# Patient Record
Sex: Male | Born: 1965
Health system: Southern US, Community
[De-identification: ages and names within clinical notes are randomized; demographics above are authoritative.]

## PROBLEM LIST (undated history)

## (undated) DIAGNOSIS — M069 Rheumatoid arthritis, unspecified: Secondary | ICD-10-CM

## (undated) DIAGNOSIS — I420 Dilated cardiomyopathy: Secondary | ICD-10-CM

## (undated) DIAGNOSIS — E039 Hypothyroidism, unspecified: Secondary | ICD-10-CM

## (undated) DIAGNOSIS — R002 Palpitations: Secondary | ICD-10-CM

## (undated) DIAGNOSIS — E785 Hyperlipidemia, unspecified: Secondary | ICD-10-CM

## (undated) HISTORY — DX: Rheumatoid arthritis, unspecified: M06.9

## (undated) HISTORY — PX: HAND SURGERY: SHX662

## (undated) HISTORY — PX: HAND TENDON SURGERY: SHX663

## (undated) HISTORY — DX: Dilated cardiomyopathy: I42.0

## (undated) HISTORY — DX: Palpitations: R00.2

## (undated) HISTORY — DX: Hypothyroidism, unspecified: E03.9

## (undated) HISTORY — DX: Hyperlipidemia, unspecified: E78.5

---

## 2015-09-13 DIAGNOSIS — M0579 Rheumatoid arthritis with rheumatoid factor of multiple sites without organ or systems involvement: Secondary | ICD-10-CM | POA: Insufficient documentation

## 2015-09-26 DIAGNOSIS — H04129 Dry eye syndrome of unspecified lacrimal gland: Secondary | ICD-10-CM | POA: Insufficient documentation

## 2015-09-26 DIAGNOSIS — E291 Testicular hypofunction: Secondary | ICD-10-CM | POA: Insufficient documentation

## 2015-09-26 DIAGNOSIS — R002 Palpitations: Secondary | ICD-10-CM | POA: Insufficient documentation

## 2015-09-26 DIAGNOSIS — H9190 Unspecified hearing loss, unspecified ear: Secondary | ICD-10-CM | POA: Insufficient documentation

## 2015-09-26 DIAGNOSIS — I10 Essential (primary) hypertension: Secondary | ICD-10-CM | POA: Insufficient documentation

## 2015-09-26 DIAGNOSIS — E039 Hypothyroidism, unspecified: Secondary | ICD-10-CM | POA: Insufficient documentation

## 2015-10-04 DIAGNOSIS — R7611 Nonspecific reaction to tuberculin skin test without active tuberculosis: Secondary | ICD-10-CM | POA: Insufficient documentation

## 2016-02-29 DIAGNOSIS — R21 Rash and other nonspecific skin eruption: Secondary | ICD-10-CM | POA: Diagnosis not present

## 2016-04-16 DIAGNOSIS — M659 Synovitis and tenosynovitis, unspecified: Secondary | ICD-10-CM | POA: Insufficient documentation

## 2016-04-16 DIAGNOSIS — M65939 Unspecified synovitis and tenosynovitis, unspecified forearm: Secondary | ICD-10-CM | POA: Insufficient documentation

## 2016-04-17 ENCOUNTER — Ambulatory Visit: Payer: BLUE CROSS/BLUE SHIELD | Admitting: Internal Medicine

## 2016-04-23 DIAGNOSIS — M25531 Pain in right wrist: Secondary | ICD-10-CM | POA: Insufficient documentation

## 2016-06-04 DIAGNOSIS — M25531 Pain in right wrist: Secondary | ICD-10-CM | POA: Diagnosis not present

## 2016-06-04 DIAGNOSIS — M654 Radial styloid tenosynovitis [de Quervain]: Secondary | ICD-10-CM | POA: Diagnosis not present

## 2016-07-09 DIAGNOSIS — Z79899 Other long term (current) drug therapy: Secondary | ICD-10-CM | POA: Diagnosis not present

## 2016-07-09 DIAGNOSIS — M0579 Rheumatoid arthritis with rheumatoid factor of multiple sites without organ or systems involvement: Secondary | ICD-10-CM | POA: Diagnosis not present

## 2016-08-13 DIAGNOSIS — E291 Testicular hypofunction: Secondary | ICD-10-CM | POA: Diagnosis not present

## 2016-08-31 DIAGNOSIS — L509 Urticaria, unspecified: Secondary | ICD-10-CM | POA: Diagnosis not present

## 2016-08-31 DIAGNOSIS — B360 Pityriasis versicolor: Secondary | ICD-10-CM | POA: Diagnosis not present

## 2016-09-19 DIAGNOSIS — I420 Dilated cardiomyopathy: Secondary | ICD-10-CM | POA: Insufficient documentation

## 2016-09-19 DIAGNOSIS — R002 Palpitations: Secondary | ICD-10-CM | POA: Diagnosis not present

## 2016-09-19 DIAGNOSIS — M0579 Rheumatoid arthritis with rheumatoid factor of multiple sites without organ or systems involvement: Secondary | ICD-10-CM | POA: Diagnosis not present

## 2016-09-28 DIAGNOSIS — M0579 Rheumatoid arthritis with rheumatoid factor of multiple sites without organ or systems involvement: Secondary | ICD-10-CM | POA: Diagnosis not present

## 2016-09-28 DIAGNOSIS — Z79899 Other long term (current) drug therapy: Secondary | ICD-10-CM | POA: Diagnosis not present

## 2016-10-17 DIAGNOSIS — Z79899 Other long term (current) drug therapy: Secondary | ICD-10-CM | POA: Diagnosis not present

## 2016-10-17 DIAGNOSIS — M0579 Rheumatoid arthritis with rheumatoid factor of multiple sites without organ or systems involvement: Secondary | ICD-10-CM | POA: Diagnosis not present

## 2016-10-17 DIAGNOSIS — M654 Radial styloid tenosynovitis [de Quervain]: Secondary | ICD-10-CM | POA: Diagnosis not present

## 2016-11-14 DIAGNOSIS — I420 Dilated cardiomyopathy: Secondary | ICD-10-CM | POA: Diagnosis not present

## 2016-11-14 DIAGNOSIS — M0579 Rheumatoid arthritis with rheumatoid factor of multiple sites without organ or systems involvement: Secondary | ICD-10-CM | POA: Diagnosis not present

## 2016-11-14 DIAGNOSIS — R002 Palpitations: Secondary | ICD-10-CM | POA: Diagnosis not present

## 2016-12-14 DIAGNOSIS — M069 Rheumatoid arthritis, unspecified: Secondary | ICD-10-CM | POA: Diagnosis not present

## 2016-12-14 DIAGNOSIS — R002 Palpitations: Secondary | ICD-10-CM | POA: Diagnosis not present

## 2017-01-02 DIAGNOSIS — M654 Radial styloid tenosynovitis [de Quervain]: Secondary | ICD-10-CM | POA: Diagnosis not present

## 2017-01-03 DIAGNOSIS — M654 Radial styloid tenosynovitis [de Quervain]: Secondary | ICD-10-CM | POA: Diagnosis not present

## 2017-01-03 DIAGNOSIS — Z79899 Other long term (current) drug therapy: Secondary | ICD-10-CM | POA: Diagnosis not present

## 2017-01-03 DIAGNOSIS — B372 Candidiasis of skin and nail: Secondary | ICD-10-CM | POA: Diagnosis not present

## 2017-01-03 DIAGNOSIS — M069 Rheumatoid arthritis, unspecified: Secondary | ICD-10-CM | POA: Diagnosis not present

## 2017-01-13 DIAGNOSIS — Z79899 Other long term (current) drug therapy: Secondary | ICD-10-CM | POA: Diagnosis not present

## 2017-01-21 DIAGNOSIS — M654 Radial styloid tenosynovitis [de Quervain]: Secondary | ICD-10-CM | POA: Diagnosis not present

## 2017-01-21 DIAGNOSIS — M65841 Other synovitis and tenosynovitis, right hand: Secondary | ICD-10-CM | POA: Diagnosis not present

## 2017-01-28 DIAGNOSIS — M654 Radial styloid tenosynovitis [de Quervain]: Secondary | ICD-10-CM | POA: Diagnosis not present

## 2017-01-30 DIAGNOSIS — E291 Testicular hypofunction: Secondary | ICD-10-CM | POA: Diagnosis not present

## 2017-02-25 DIAGNOSIS — Z79899 Other long term (current) drug therapy: Secondary | ICD-10-CM | POA: Diagnosis not present

## 2017-02-25 DIAGNOSIS — M79644 Pain in right finger(s): Secondary | ICD-10-CM | POA: Diagnosis not present

## 2017-02-25 DIAGNOSIS — M069 Rheumatoid arthritis, unspecified: Secondary | ICD-10-CM | POA: Diagnosis not present

## 2017-02-27 DIAGNOSIS — E291 Testicular hypofunction: Secondary | ICD-10-CM | POA: Diagnosis not present

## 2017-03-02 DIAGNOSIS — Z79899 Other long term (current) drug therapy: Secondary | ICD-10-CM | POA: Diagnosis not present

## 2017-03-02 DIAGNOSIS — M069 Rheumatoid arthritis, unspecified: Secondary | ICD-10-CM | POA: Diagnosis not present

## 2017-04-01 DIAGNOSIS — M65841 Other synovitis and tenosynovitis, right hand: Secondary | ICD-10-CM | POA: Diagnosis not present

## 2017-04-28 DIAGNOSIS — Z79899 Other long term (current) drug therapy: Secondary | ICD-10-CM | POA: Diagnosis not present

## 2017-04-29 DIAGNOSIS — M654 Radial styloid tenosynovitis [de Quervain]: Secondary | ICD-10-CM | POA: Diagnosis not present

## 2017-04-29 DIAGNOSIS — M79641 Pain in right hand: Secondary | ICD-10-CM | POA: Diagnosis not present

## 2017-04-29 DIAGNOSIS — M659 Synovitis and tenosynovitis, unspecified: Secondary | ICD-10-CM | POA: Diagnosis not present

## 2017-06-24 DIAGNOSIS — M659 Synovitis and tenosynovitis, unspecified: Secondary | ICD-10-CM | POA: Diagnosis not present

## 2017-06-24 DIAGNOSIS — Z23 Encounter for immunization: Secondary | ICD-10-CM | POA: Diagnosis not present

## 2017-06-24 DIAGNOSIS — Z79899 Other long term (current) drug therapy: Secondary | ICD-10-CM | POA: Diagnosis not present

## 2017-06-24 DIAGNOSIS — M79641 Pain in right hand: Secondary | ICD-10-CM | POA: Diagnosis not present

## 2017-06-24 DIAGNOSIS — M069 Rheumatoid arthritis, unspecified: Secondary | ICD-10-CM | POA: Diagnosis not present

## 2017-08-08 DIAGNOSIS — M79644 Pain in right finger(s): Secondary | ICD-10-CM | POA: Diagnosis not present

## 2017-08-08 DIAGNOSIS — E291 Testicular hypofunction: Secondary | ICD-10-CM | POA: Diagnosis not present

## 2017-08-08 DIAGNOSIS — M069 Rheumatoid arthritis, unspecified: Secondary | ICD-10-CM | POA: Diagnosis not present

## 2017-08-09 DIAGNOSIS — Z79899 Other long term (current) drug therapy: Secondary | ICD-10-CM | POA: Diagnosis not present

## 2017-08-09 DIAGNOSIS — M12842 Other specific arthropathies, not elsewhere classified, left hand: Secondary | ICD-10-CM | POA: Diagnosis not present

## 2017-08-09 DIAGNOSIS — M12841 Other specific arthropathies, not elsewhere classified, right hand: Secondary | ICD-10-CM | POA: Diagnosis not present

## 2017-08-09 DIAGNOSIS — M0579 Rheumatoid arthritis with rheumatoid factor of multiple sites without organ or systems involvement: Secondary | ICD-10-CM | POA: Diagnosis not present

## 2017-08-30 DIAGNOSIS — M069 Rheumatoid arthritis, unspecified: Secondary | ICD-10-CM | POA: Diagnosis not present

## 2017-08-30 DIAGNOSIS — Z79899 Other long term (current) drug therapy: Secondary | ICD-10-CM | POA: Diagnosis not present

## 2017-08-30 DIAGNOSIS — M25541 Pain in joints of right hand: Secondary | ICD-10-CM | POA: Diagnosis not present

## 2017-08-30 DIAGNOSIS — M0579 Rheumatoid arthritis with rheumatoid factor of multiple sites without organ or systems involvement: Secondary | ICD-10-CM | POA: Diagnosis not present

## 2017-08-30 DIAGNOSIS — M25542 Pain in joints of left hand: Secondary | ICD-10-CM | POA: Diagnosis not present

## 2017-10-02 DIAGNOSIS — M25542 Pain in joints of left hand: Secondary | ICD-10-CM | POA: Diagnosis not present

## 2017-10-02 DIAGNOSIS — M65839 Other synovitis and tenosynovitis, unspecified forearm: Secondary | ICD-10-CM | POA: Diagnosis not present

## 2017-10-02 DIAGNOSIS — M069 Rheumatoid arthritis, unspecified: Secondary | ICD-10-CM | POA: Diagnosis not present

## 2017-10-27 DIAGNOSIS — M0579 Rheumatoid arthritis with rheumatoid factor of multiple sites without organ or systems involvement: Secondary | ICD-10-CM | POA: Diagnosis not present

## 2017-10-27 DIAGNOSIS — E039 Hypothyroidism, unspecified: Secondary | ICD-10-CM | POA: Diagnosis not present

## 2017-10-28 DIAGNOSIS — M25542 Pain in joints of left hand: Secondary | ICD-10-CM | POA: Diagnosis not present

## 2017-10-28 DIAGNOSIS — M65839 Other synovitis and tenosynovitis, unspecified forearm: Secondary | ICD-10-CM | POA: Diagnosis not present

## 2017-10-28 DIAGNOSIS — M069 Rheumatoid arthritis, unspecified: Secondary | ICD-10-CM | POA: Diagnosis not present

## 2018-01-06 DIAGNOSIS — M25542 Pain in joints of left hand: Secondary | ICD-10-CM | POA: Diagnosis not present

## 2018-01-06 DIAGNOSIS — E291 Testicular hypofunction: Secondary | ICD-10-CM | POA: Diagnosis not present

## 2018-01-06 DIAGNOSIS — M069 Rheumatoid arthritis, unspecified: Secondary | ICD-10-CM | POA: Diagnosis not present

## 2018-01-06 DIAGNOSIS — M65832 Other synovitis and tenosynovitis, left forearm: Secondary | ICD-10-CM | POA: Diagnosis not present

## 2018-01-06 DIAGNOSIS — Z125 Encounter for screening for malignant neoplasm of prostate: Secondary | ICD-10-CM | POA: Diagnosis not present

## 2018-01-09 ENCOUNTER — Encounter: Payer: Self-pay | Admitting: Cardiology

## 2018-01-09 ENCOUNTER — Ambulatory Visit: Payer: BLUE CROSS/BLUE SHIELD | Admitting: Cardiology

## 2018-01-09 VITALS — BP 92/60 | HR 56 | Ht 73.5 in | Wt 185.8 lb

## 2018-01-09 DIAGNOSIS — I420 Dilated cardiomyopathy: Secondary | ICD-10-CM

## 2018-01-09 DIAGNOSIS — R002 Palpitations: Secondary | ICD-10-CM

## 2018-01-09 NOTE — Patient Instructions (Signed)
Medication Instructions:  Your physician recommends that you continue on your current medications as directed. Please refer to the Current Medication list given to you today.   Labwork: None  Testing/Procedures: You had an EKG today.   Your physician has requested that you have an echocardiogram. Echocardiography is a painless test that uses sound waves to create images of your heart. It provides your doctor with information about the size and shape of your heart and how well your heart's chambers and valves are working. This procedure takes approximately one hour. There are no restrictions for this procedure.  Follow-Up: Your physician recommends that you schedule a follow-up appointment after getting the results from your echocardiogram.   If you need a refill on your cardiac medications before your next appointment, please call your pharmacy.   Thank you for choosing [facility]! Mady Gemmaatherine Jatniel Verastegui, RN [phone]

## 2018-01-09 NOTE — Progress Notes (Signed)
Cardiology Office Note:    Date:  01/09/2018   ID:  Cameron Olson, DOB 04/29/1966, MRN [id number]  PCP:  Francee GentileZiolkowska, Aldona, MD  Cardiologist:  Gypsy Balsamobert Krasowski, MD    Referring MD: No ref. provider found   Chief Complaint  Patient presents with  . Follow-up  I had episodes of dizziness  History of Present Illness:    Cameron Damaicolae Bolt is a 52 y.o. male with remote history of cardiomyopathy however normalization of ejection fraction occurred since that time he has been doing well.  Also got episodes of palpitations form of premature ventricular beats managed successfully with beta-blocker.  Couple weeks ago he was exercising at the gym he pushed himself heart on elliptical he started getting somewhat dizzy and then he ended up going to the weight machine and started lifting some weights became dizzy got up went outside went to his apartment barely made it hard to lay down in the bed for about 10 minutes until he was fine.  All the day he felt weak and tired.  He drank plenty of fluids when he was exercising but again he pushed unusually hard himself during his exercise session.  That time he did not exercise. He denies having a chest pain tightness squeezing pressure burning chest  No past medical history on file.    Current Medications: Current Meds  Medication Sig  . Adalimumab (HUMIRA PEN) 40 MG/0.4ML PNKT Inject 40 mg into the muscle every 14 (fourteen) days.  . carvedilol (COREG) 6.25 MG tablet Take 1 tablet by mouth 3 (three) times daily.   . Cholecalciferol (VITAMIN D-1000 MAX ST) 1000 units tablet Take 1 tablet by mouth daily.  . DOCOSAHEXAENOIC ACID PO Take 1 g by mouth daily.  . folic acid (FOLVITE) 1 MG tablet Take 1 tablet by mouth daily.  . hydroxychloroquine (PLAQUENIL) 200 MG tablet Take 1 tablet by mouth 2 (two) times daily.   Marland Kitchen. levothyroxine (SYNTHROID) 75 MCG tablet Take 1 tablet by mouth daily.  . vitamin B-12 (CYANOCOBALAMIN) 1000 MCG tablet Take 1 tablet by mouth  daily.     Allergies:   Patient has no known allergies.   Social History   Socioeconomic History  . Marital status: Unknown    Spouse name: Not on file  . Number of children: Not on file  . Years of education: Not on file  . Highest education level: Not on file  Occupational History  . Not on file  Social Needs  . Financial resource strain: Not on file  . Food insecurity:    Worry: Not on file    Inability: Not on file  . Transportation needs:    Medical: Not on file    Non-medical: Not on file  Tobacco Use  . Smoking status: Never Smoker  . Smokeless tobacco: Never Used  Substance and Sexual Activity  . Alcohol use: Never    Frequency: Never  . Drug use: Never  . Sexual activity: Not on file  Lifestyle  . Physical activity:    Days per week: Not on file    Minutes per session: Not on file  . Stress: Not on file  Relationships  . Social connections:    Talks on phone: Not on file    Gets together: Not on file    Attends religious service: Not on file    Active member of club or organization: Not on file    Attends meetings of clubs or organizations: Not on file    Relationship  status: Not on file  Other Topics Concern  . Not on file  Social History Narrative  . Not on file     Family History: The patient's family history includes Diabetes in his mother; Heart disease in his mother; Hypertension in his father. ROS:   Please see the history of present illness.    All 14 point review of systems negative except as described per history of present illness  EKGs/Labs/Other Studies Reviewed:      Recent Labs: No results found for requested labs within last 8760 hours.  Recent Lipid Panel No results found for: CHOL, TRIG, HDL, CHOLHDL, VLDL, LDLCALC, LDLDIRECT  Physical Exam:    VS:  BP 92/60   Pulse (!) 56   Ht 6' 1.5" (1.867 m)   Wt 185 lb 12.8 oz (84.3 kg)   SpO2 98%   BMI 24.18 kg/m     Wt Readings from Last 3 Encounters:  01/09/18 185 lb 12.8  oz (84.3 kg)     GEN:  Well nourished, well developed in no acute distress HEENT: Normal NECK: No JVD; No carotid bruits LYMPHATICS: No lymphadenopathy CARDIAC: RRR, no murmurs, no rubs, no gallops RESPIRATORY:  Clear to auscultation without rales, wheezing or rhonchi  ABDOMEN: Soft, non-tender, non-distended MUSCULOSKELETAL:  No edema; No deformity  SKIN: Warm and dry LOWER EXTREMITIES: no swelling NEUROLOGIC:  Alert and oriented x 3 PSYCHIATRIC:  Normal affect   ASSESSMENT:    1. Dilated cardiomyopathy (HCC)   2. Palpitations    PLAN:    In order of problems listed above:  1. Episodes of dizziness I suspect he simply push himself too hard while exercising.  I told him that he need to drink plenty of fluids when he exercise also he need to drink and eat before he goes and exercise.  He only had some almond milk before he went to exercise. 2. Since he does have remote history of cardia myopathy I will ask him to have echocardiogram to assess his left ventricular ejection fraction. 3. I see him back in the office within next few weeks to months.  He lives in [place] right now he will be in touch with me over the email. 4. I told him not to exercise until we get his echocardiogram.    Medication Adjustments/Labs and Tests Ordered: Current medicines are reviewed at length with the patient today.  Concerns regarding medicines are outlined above.  No orders of the defined types were placed in this encounter.  Medication changes: No orders of the defined types were placed in this encounter.   Signed, Georgeanna Lea, MD, [facility] 01/09/2018 4:44 PM    [facility]

## 2018-01-11 DIAGNOSIS — Z79899 Other long term (current) drug therapy: Secondary | ICD-10-CM | POA: Diagnosis not present

## 2018-01-11 DIAGNOSIS — M0579 Rheumatoid arthritis with rheumatoid factor of multiple sites without organ or systems involvement: Secondary | ICD-10-CM | POA: Diagnosis not present

## 2018-01-13 ENCOUNTER — Ambulatory Visit (HOSPITAL_COMMUNITY): Payer: BLUE CROSS/BLUE SHIELD | Attending: Cardiovascular Disease

## 2018-01-13 ENCOUNTER — Other Ambulatory Visit: Payer: Self-pay

## 2018-01-13 DIAGNOSIS — I420 Dilated cardiomyopathy: Secondary | ICD-10-CM | POA: Diagnosis not present

## 2018-01-13 DIAGNOSIS — E291 Testicular hypofunction: Secondary | ICD-10-CM | POA: Diagnosis not present

## 2018-01-13 DIAGNOSIS — R002 Palpitations: Secondary | ICD-10-CM

## 2018-01-13 DIAGNOSIS — N5201 Erectile dysfunction due to arterial insufficiency: Secondary | ICD-10-CM | POA: Diagnosis not present

## 2018-01-13 DIAGNOSIS — I10 Essential (primary) hypertension: Secondary | ICD-10-CM | POA: Diagnosis not present

## 2018-01-14 ENCOUNTER — Other Ambulatory Visit: Payer: Self-pay

## 2018-01-14 ENCOUNTER — Telehealth: Payer: Self-pay

## 2018-01-14 MED ORDER — CARVEDILOL 25 MG PO TABS: 25.0000 mg | ORAL_TABLET | Freq: Two times a day (BID) | ORAL | 3 refills | Status: DC

## 2018-01-14 MED ORDER — CARVEDILOL 6.25 MG PO TABS: 3.1250 mg | ORAL_TABLET | Freq: Every day | ORAL | 5 refills | Status: DC

## 2018-01-14 NOTE — Telephone Encounter (Signed)
Left message to return call to confirm dosage of carvedilol prior to sending in refill for 90 day supply to [facility] per message from Dr. Bing MatterKrasowski.

## 2018-01-14 NOTE — Telephone Encounter (Signed)
Per Dr. Bing MatterKrasowski patient may take 25 mg of carvedilol twice daily and additional 3.125 mg carvedilol in the evening. If needed the 3.125 mg carvedilol maybe increased to 6.25 mg every evening. Patient was agreeable.

## 2018-01-31 ENCOUNTER — Encounter: Payer: Self-pay | Admitting: Internal Medicine

## 2018-02-11 ENCOUNTER — Institutional Professional Consult (permissible substitution): Payer: BLUE CROSS/BLUE SHIELD | Admitting: Internal Medicine

## 2018-02-11 ENCOUNTER — Encounter: Payer: Self-pay | Admitting: Cardiology

## 2018-02-11 ENCOUNTER — Ambulatory Visit (INDEPENDENT_AMBULATORY_CARE_PROVIDER_SITE_OTHER): Payer: BLUE CROSS/BLUE SHIELD | Admitting: Cardiology

## 2018-02-11 VITALS — BP 98/72 | HR 51 | Ht 73.5 in | Wt 197.0 lb

## 2018-02-11 DIAGNOSIS — I420 Dilated cardiomyopathy: Secondary | ICD-10-CM

## 2018-02-11 DIAGNOSIS — I10 Essential (primary) hypertension: Secondary | ICD-10-CM

## 2018-02-11 DIAGNOSIS — M0579 Rheumatoid arthritis with rheumatoid factor of multiple sites without organ or systems involvement: Secondary | ICD-10-CM | POA: Diagnosis not present

## 2018-02-11 DIAGNOSIS — Z79899 Other long term (current) drug therapy: Secondary | ICD-10-CM | POA: Diagnosis not present

## 2018-02-11 DIAGNOSIS — R002 Palpitations: Secondary | ICD-10-CM

## 2018-02-11 DIAGNOSIS — B029 Zoster without complications: Secondary | ICD-10-CM | POA: Diagnosis not present

## 2018-02-11 NOTE — Patient Instructions (Signed)
Medication Instructions:  Your physician recommends that you continue on your current medications as directed. Please refer to the Current Medication list given to you today.  Labwork: None  Testing/Procedures: None  Follow-Up: Please call for a follow up appointment  Any Other Special Instructions Will Be Listed Below (If Applicable).     If you need a refill on your cardiac medications before your next appointment, please call your pharmacy.   [facility]  Garey HamAshley A, RN, BSN

## 2018-02-11 NOTE — Progress Notes (Signed)
Cardiology Office Note:    Date:  02/11/2018   ID:  Cameron Olson, DOB 12/04/1965, MRN [id number]  PCP:  Francee GentileZiolkowska, Aldona, MD  Cardiologist:  Gypsy Balsamobert Simren Popson, MD    Referring MD: Francee GentileZiolkowska, Aldona, MD   No chief complaint on file. Doing better  History of Present Illness:    Cameron Damaicolae Dumais is a 52 y.o. male with palpitations.  Also remote history of cardiomyopathy but with normalization.  He is being in touch with me over email.  He is sending multiple EKGs so far all EKG showed normal sinus rhythm only one rhythm strip showed some multifocal atrial tachycardia.  Recently he was diagnosed with shingles he is been treated aggressively with antibiotic and doing better still complain of having some pain in the left armpit where he gets his shingles.  Also does not exercise on the regular basis but overall considering his feeling better.  Past Medical History:  Diagnosis Date  . Dilated cardiomyopathy (HCC)   . Palpitations     Past Surgical History:  Procedure Laterality Date  . HAND SURGERY      Current Medications: Current Meds  Medication Sig  . Adalimumab (HUMIRA PEN) 40 MG/0.4ML PNKT Inject 40 mg into the muscle every 14 (fourteen) days.  . carvedilol (COREG) 25 MG tablet Take 1 tablet (25 mg total) by mouth 2 (two) times daily with a meal.  . carvedilol (COREG) 6.25 MG tablet Take 0.5 tablets (3.125 mg total) by mouth daily. May increase to 6.25 mg daily if needed  . Cholecalciferol (VITAMIN D-1000 MAX ST) 1000 units tablet Take 1 tablet by mouth daily.  . folic acid (FOLVITE) 1 MG tablet Take 1 tablet by mouth daily.  . hydroxychloroquine (PLAQUENIL) 200 MG tablet Take 1 tablet by mouth 2 (two) times daily.   Marland Kitchen. levothyroxine (SYNTHROID) 75 MCG tablet Take 1 tablet by mouth daily.  . vitamin B-12 (CYANOCOBALAMIN) 1000 MCG tablet Take 1 tablet by mouth daily.     Allergies:   Patient has no known allergies.   Social History   Socioeconomic History  . Marital status:  Unknown    Spouse name: Not on file  . Number of children: Not on file  . Years of education: Not on file  . Highest education level: Not on file  Occupational History  . Not on file  Social Needs  . Financial resource strain: Not on file  . Food insecurity:    Worry: Not on file    Inability: Not on file  . Transportation needs:    Medical: Not on file    Non-medical: Not on file  Tobacco Use  . Smoking status: Never Smoker  . Smokeless tobacco: Never Used  Substance and Sexual Activity  . Alcohol use: Never    Frequency: Never  . Drug use: Never  . Sexual activity: Not on file  Lifestyle  . Physical activity:    Days per week: Not on file    Minutes per session: Not on file  . Stress: Not on file  Relationships  . Social connections:    Talks on phone: Not on file    Gets together: Not on file    Attends religious service: Not on file    Active member of club or organization: Not on file    Attends meetings of clubs or organizations: Not on file    Relationship status: Not on file  Other Topics Concern  . Not on file  Social History Narrative  . Not  on file     Family History: The patient's family history includes Diabetes in his mother; Heart disease in his mother; Hypertension in his father. ROS:   Please see the history of present illness.    All 14 point review of systems negative except as described per history of present illness  EKGs/Labs/Other Studies Reviewed:      Recent Labs: No results found for requested labs within last 8760 hours.  Recent Lipid Panel No results found for: CHOL, TRIG, HDL, CHOLHDL, VLDL, LDLCALC, LDLDIRECT  Physical Exam:    VS:  BP 98/72 (BP Location: Right Arm, Patient Position: Sitting, Cuff Size: Normal)   Pulse (!) 51   Ht 6' 1.5" (1.867 m)   Wt 197 lb (89.4 kg)   SpO2 99%   BMI 25.64 kg/m     Wt Readings from Last 3 Encounters:  02/11/18 197 lb (89.4 kg)  01/09/18 185 lb 12.8 oz (84.3 kg)     GEN:  Well  nourished, well developed in no acute distress HEENT: Normal NECK: No JVD; No carotid bruits LYMPHATICS: No lymphadenopathy CARDIAC: RRR, no murmurs, no rubs, no gallops RESPIRATORY:  Clear to auscultation without rales, wheezing or rhonchi  ABDOMEN: Soft, non-tender, non-distended MUSCULOSKELETAL:  No edema; No deformity  SKIN: Warm and dry LOWER EXTREMITIES: no swelling NEUROLOGIC:  Alert and oriented x 3 PSYCHIATRIC:  Normal affect   ASSESSMENT:    1. Benign essential hypertension   2. Palpitations   3. Rheumatoid arthritis involving multiple sites with positive rheumatoid factor (HCC)   4. Dilated cardiomyopathy (HCC)    PLAN:    In order of problems listed above:  1. Benign essential hypertension blood pressure well controlled continue present medications. 2. Palpitations with possibly multifocal atrial tachycardia.  He is doing better from that point review we started making some arrangements to see EP specialist however eventually ended up canceling this.  He will be back and states in October and at that time I will make arrangements for him to see EP office everything the past how he will feel. 3. Rheumatoid arthritis.  Followed by rheumatologist he is getting ready to see a rheumatologist today. 4. Remote history of dilated cardiomyopathy normalization of ejection fraction.  She brought some records from [place] that I have a chance to review.  Echocardiogram showed preserved left ventricular ejection fraction.  She is scheduled to have a Holter monitor done in [place] who will send results to me.   Medication Adjustments/Labs and Tests Ordered: Current medicines are reviewed at length with the patient today.  Concerns regarding medicines are outlined above.  No orders of the defined types were placed in this encounter.  Medication changes: No orders of the defined types were placed in this encounter.   Signed, Georgeanna Lea, MD, [facility] 02/11/2018 2:25 PM    [facility]

## 2018-02-12 DIAGNOSIS — M25542 Pain in joints of left hand: Secondary | ICD-10-CM | POA: Diagnosis not present

## 2018-05-27 DIAGNOSIS — M0579 Rheumatoid arthritis with rheumatoid factor of multiple sites without organ or systems involvement: Secondary | ICD-10-CM | POA: Diagnosis not present

## 2018-05-27 DIAGNOSIS — Z79899 Other long term (current) drug therapy: Secondary | ICD-10-CM | POA: Diagnosis not present

## 2018-05-28 DIAGNOSIS — M069 Rheumatoid arthritis, unspecified: Secondary | ICD-10-CM | POA: Diagnosis not present

## 2018-05-28 DIAGNOSIS — M25511 Pain in right shoulder: Secondary | ICD-10-CM | POA: Insufficient documentation

## 2018-05-28 DIAGNOSIS — Z79899 Other long term (current) drug therapy: Secondary | ICD-10-CM | POA: Diagnosis not present

## 2018-05-28 DIAGNOSIS — M25542 Pain in joints of left hand: Secondary | ICD-10-CM | POA: Diagnosis not present

## 2018-05-28 DIAGNOSIS — D72810 Lymphocytopenia: Secondary | ICD-10-CM | POA: Diagnosis not present

## 2018-05-28 DIAGNOSIS — M0579 Rheumatoid arthritis with rheumatoid factor of multiple sites without organ or systems involvement: Secondary | ICD-10-CM | POA: Diagnosis not present

## 2018-05-29 DIAGNOSIS — D72810 Lymphocytopenia: Secondary | ICD-10-CM | POA: Diagnosis not present

## 2018-08-20 DIAGNOSIS — M0579 Rheumatoid arthritis with rheumatoid factor of multiple sites without organ or systems involvement: Secondary | ICD-10-CM | POA: Diagnosis not present

## 2018-08-20 DIAGNOSIS — M069 Rheumatoid arthritis, unspecified: Secondary | ICD-10-CM | POA: Diagnosis not present

## 2018-08-20 DIAGNOSIS — M7541 Impingement syndrome of right shoulder: Secondary | ICD-10-CM | POA: Diagnosis not present

## 2018-08-20 DIAGNOSIS — E291 Testicular hypofunction: Secondary | ICD-10-CM | POA: Diagnosis not present

## 2018-08-20 DIAGNOSIS — Z79899 Other long term (current) drug therapy: Secondary | ICD-10-CM | POA: Diagnosis not present

## 2018-08-20 DIAGNOSIS — M25511 Pain in right shoulder: Secondary | ICD-10-CM | POA: Diagnosis not present

## 2018-11-28 DIAGNOSIS — M2550 Pain in unspecified joint: Secondary | ICD-10-CM | POA: Diagnosis not present

## 2018-12-08 DIAGNOSIS — M7541 Impingement syndrome of right shoulder: Secondary | ICD-10-CM | POA: Diagnosis not present

## 2018-12-08 DIAGNOSIS — M25542 Pain in joints of left hand: Secondary | ICD-10-CM | POA: Diagnosis not present

## 2018-12-08 DIAGNOSIS — M069 Rheumatoid arthritis, unspecified: Secondary | ICD-10-CM | POA: Diagnosis not present

## 2018-12-08 DIAGNOSIS — M25511 Pain in right shoulder: Secondary | ICD-10-CM | POA: Diagnosis not present

## 2018-12-20 DIAGNOSIS — R200 Anesthesia of skin: Secondary | ICD-10-CM | POA: Diagnosis not present

## 2019-01-23 DIAGNOSIS — R21 Rash and other nonspecific skin eruption: Secondary | ICD-10-CM | POA: Diagnosis not present

## 2019-01-23 DIAGNOSIS — B360 Pityriasis versicolor: Secondary | ICD-10-CM | POA: Diagnosis not present

## 2019-01-23 DIAGNOSIS — L2089 Other atopic dermatitis: Secondary | ICD-10-CM | POA: Diagnosis not present

## 2019-01-26 DIAGNOSIS — M7541 Impingement syndrome of right shoulder: Secondary | ICD-10-CM | POA: Diagnosis not present

## 2019-01-26 DIAGNOSIS — M069 Rheumatoid arthritis, unspecified: Secondary | ICD-10-CM | POA: Diagnosis not present

## 2019-01-26 DIAGNOSIS — M25511 Pain in right shoulder: Secondary | ICD-10-CM | POA: Diagnosis not present

## 2019-01-26 DIAGNOSIS — M25542 Pain in joints of left hand: Secondary | ICD-10-CM | POA: Diagnosis not present

## 2019-01-29 ENCOUNTER — Telehealth: Payer: BC Managed Care – PPO | Admitting: Cardiology

## 2019-01-29 ENCOUNTER — Other Ambulatory Visit: Payer: Self-pay

## 2019-01-30 ENCOUNTER — Other Ambulatory Visit: Payer: Self-pay | Admitting: Cardiology

## 2019-01-30 NOTE — Telephone Encounter (Signed)
Phoned patient, informed we have a refill request for carvedilol and need to clarify how he's taking medication. States he's in a conference and will call us back with that information. States he doesn't need the refill right now.

## 2019-02-11 DIAGNOSIS — M2550 Pain in unspecified joint: Secondary | ICD-10-CM | POA: Diagnosis not present

## 2019-02-11 DIAGNOSIS — Z79899 Other long term (current) drug therapy: Secondary | ICD-10-CM | POA: Diagnosis not present

## 2019-02-11 DIAGNOSIS — M0579 Rheumatoid arthritis with rheumatoid factor of multiple sites without organ or systems involvement: Secondary | ICD-10-CM | POA: Diagnosis not present

## 2019-02-11 DIAGNOSIS — R200 Anesthesia of skin: Secondary | ICD-10-CM | POA: Diagnosis not present

## 2019-02-12 ENCOUNTER — Telehealth (INDEPENDENT_AMBULATORY_CARE_PROVIDER_SITE_OTHER): Payer: BC Managed Care – PPO | Admitting: Cardiology

## 2019-02-12 ENCOUNTER — Encounter: Payer: Self-pay | Admitting: Cardiology

## 2019-02-12 ENCOUNTER — Other Ambulatory Visit: Payer: Self-pay

## 2019-02-12 VITALS — Wt 185.0 lb

## 2019-02-12 DIAGNOSIS — I10 Essential (primary) hypertension: Secondary | ICD-10-CM

## 2019-02-12 DIAGNOSIS — M0579 Rheumatoid arthritis with rheumatoid factor of multiple sites without organ or systems involvement: Secondary | ICD-10-CM

## 2019-02-12 DIAGNOSIS — I420 Dilated cardiomyopathy: Secondary | ICD-10-CM

## 2019-02-12 MED ORDER — CARVEDILOL 6.25 MG PO TABS: 6.2500 mg | ORAL_TABLET | Freq: Every day | ORAL | 1 refills | Status: DC

## 2019-02-12 MED ORDER — CARVEDILOL 25 MG PO TABS: 25.0000 mg | ORAL_TABLET | Freq: Every day | ORAL | 1 refills | Status: DC

## 2019-02-12 MED ORDER — CARVEDILOL 6.25 MG PO TABS: 6.2500 mg | ORAL_TABLET | Freq: Two times a day (BID) | ORAL | 1 refills | Status: DC

## 2019-02-12 NOTE — Addendum Note (Signed)
Addended by: Particia Nearing B on: 02/12/2019 05:01 PM   Modules accepted: Orders

## 2019-02-12 NOTE — Patient Instructions (Addendum)
Medication Instructions:  Your physician has recommended you make the following change in your medication:   COREG 6.25 mg: 1 tab twice daily And Coreg 25 mg : 1 tab daily   If you need a refill on your cardiac medications before your next appointment, please call your pharmacy.   Lab work: None If you have labs (blood work) drawn today and your tests are completely normal, you will receive your results only by: Marland Kitchen MyChart Message (if you have MyChart) OR . A paper copy in the mail If you have any lab test that is abnormal or we need to change your treatment, we will call you to review the results.  Testing/Procedures: Your physician has requested that you have an echocardiogram. Echocardiography is a painless test that uses sound waves to create images of your heart. It provides your doctor with information about the size and shape of your heart and how well your heart's chambers and valves are working. This procedure takes approximately one hour. There are no restrictions for this procedure.  YOUR APPOINTMENT FOR YOUR ECHO IS July 28,2020 at 8:15 PM  Follow-Up: At [facility], you and your health needs are our priority.  As part of our continuing mission to provide you with exceptional heart care, we have created designated Provider Care Teams.  These Care Teams include your primary Cardiologist (physician) and Advanced Practice Providers (APPs -  Physician Assistants and Nurse Practitioners) who all work together to provide you with the care you need, when you need it. You will need a follow up appointment in 3 months.  Any Other Special Instructions Will Be Listed Below (If Applicable).

## 2019-02-12 NOTE — Progress Notes (Signed)
*    Virtual Visit via Video Note   This visit type was conducted due to national recommendations for restrictions regarding the COVID-19 Pandemic (e.g. social distancing) in an effort to limit this patient's exposure and mitigate transmission in our community.  Due to his co-morbid illnesses, this patient is at least at moderate risk for complications without adequate follow up.  This format is felt to be most appropriate for this patient at this time.  All issues noted in this document were discussed and addressed.  A limited physical exam was performed with this format.  Please refer to the patient's chart for his consent to telehealth for [facility].  Evaluation Performed:  Follow-up visit  This visit type was conducted due to national recommendations for restrictions regarding the COVID-19 Pandemic (e.g. social distancing).  This format is felt to be most appropriate for this patient at this time.  All issues noted in this document were discussed and addressed.  No physical exam was performed (except for noted visual exam findings with Video Visits).  Please refer to the patient's chart (MyChart message for video visits and phone note for telephone visits) for the patient's consent to telehealth [facility].  Date:  02/12/2019  ID: Cameron Olson, DOB 03/23/1966, MRN [id number]   Patient [place] [address] [place] [id number]   Provider [facility]   [facility] [place] [facility]  PCP:  Francee GentileZiolkowska, Aldona, MD  Cardiologist:  Gypsy Balsamobert Krasowski, MD     Chief Complaint: I am not doing well  History of Present Illness:    Cameron Damaicolae Erno is a 53 y.o. male  who presents via audio/video conferencing for a telehealth visit today.  With history of cardiomyopathy discovered many years ago however normalization after that and all echocardiogram showed normal left ventricular ejection fraction.  Also palpitations some form of PVCs successfully suppressed with beta-blocker.  He  also does have rheumatoid arthritis which appears to be difficult to control.  He goes through very difficult time right now.  Because of coronavirus situation he lives in the hotel.  He lives for last 4 months in one room.  Not clearly affecting him psychologically.  The reason why he wanted to talk to me is the fact that he does have some redness in his legs.  Thinking is that this is may be neuropathy related to some of rheumatoid medication he has been using.  The low his legs are not hurting he noticed redness of his legs when he tried to run up and down stairs.  Denies having any issue with his heart there is no chest pain tightness squeezing pressure burning chest.  Palpitations seems to be under control.   The patient does not have symptoms concerning for COVID-19 infection (fever, chills, cough, or new SHORTNESS OF BREATH).    Prior CV studies:   The following studies were reviewed today:       Past Medical History:  Diagnosis Date  . Dilated cardiomyopathy (HCC)   . Palpitations     Past Surgical History:  Procedure Laterality Date  . HAND SURGERY       Current Meds  Medication Sig  . Adalimumab (HUMIRA PEN) 40 MG/0.4ML PNKT Inject 40 mg into the muscle every 14 (fourteen) days.  . carvedilol (COREG) 25 MG tablet Take 1 tablet (25 mg total) by mouth 2 (two) times daily with a meal.  . carvedilol (COREG) 6.25 MG tablet Take 0.5 tablets (3.125 mg total) by mouth daily. May increase  to 6.25 mg daily if needed  . Cholecalciferol (VITAMIN D-1000 MAX ST) 1000 units tablet Take 1 tablet by mouth daily.  . folic acid (FOLVITE) 1 MG tablet Take 1 tablet by mouth daily.  . hydroxychloroquine (PLAQUENIL) 200 MG tablet Take 1 tablet by mouth 2 (two) times daily.   Marland Kitchen levothyroxine (SYNTHROID) 75 MCG tablet Take 1 tablet by mouth daily.  . vitamin B-12 (CYANOCOBALAMIN) 1000 MCG tablet Take 1 tablet by mouth daily.      Family History: The patient's family history includes Diabetes  in his mother; Heart disease in his mother; Hypertension in his father.   ROS:   Please see the history of present illness.     All other systems reviewed and are negative.   Labs/Other Tests and Data Reviewed:     Recent Labs: No results found for requested labs within last 8760 hours.  Recent Lipid Panel No results found for: CHOL, TRIG, HDL, CHOLHDL, VLDL, LDLCALC, LDLDIRECT    Exam:    Vital Signs:  Wt 185 lb (83.9 kg)   BMI 24.08 kg/m     Wt Readings from Last 3 Encounters:  02/12/19 185 lb (83.9 kg)  02/11/18 197 lb (89.4 kg)  01/09/18 185 lb 12.8 oz (84.3 kg)     Well nourished, well developed in no acute distress. Alert awake oriented x3 talking to me over the video link.  He looks skinny almost cachectic.  Denies having any chest pain at the time of my interview no palpitations.  Diagnosis for this visit:   1. Benign essential hypertension   2. Dilated cardiomyopathy ([place])   3. Rheumatoid arthritis involving multiple sites with positive rheumatoid factor ([place])      ASSESSMENT & PLAN:    1.  Benign essential hypertension blood pressure well controlled continue present management. 2.  Dilated cardiomyopathy we will repeat echocardiogram again he described redness of his legs and symptoms swelling I will make sure he does not have significant cardiomyopathy again. 3.  Rheumatoid arthritis followed by rheumatology.  COVID-19 Education: The signs and symptoms of COVID-19 were discussed with the patient and how to seek care for testing (follow up with PCP or arrange E-visit).  The importance of social distancing was discussed today.  Patient Risk:   After full review of this patients clinical status, I feel that they are at least moderate risk at this time.  Time:   Today, I have spent 25 minutes with the patient with telehealth technology discussing pt health issues.  I spent 5 minutes reviewing her chart before the visit.  Visit was finished at 4:11 PM.     Medication Adjustments/Labs and Tests Ordered: Current medicines are reviewed at length with the patient today.  Concerns regarding medicines are outlined above.  No orders of the defined types were placed in this encounter.  Medication changes: No orders of the defined types were placed in this encounter.    Disposition: Follow-up in 5 months  Signed, Park Liter, MD, [facility] 02/12/2019 4:10 PM    [place]

## 2019-02-14 DIAGNOSIS — Z79899 Other long term (current) drug therapy: Secondary | ICD-10-CM | POA: Diagnosis not present

## 2019-02-14 DIAGNOSIS — M0579 Rheumatoid arthritis with rheumatoid factor of multiple sites without organ or systems involvement: Secondary | ICD-10-CM | POA: Diagnosis not present

## 2019-02-18 ENCOUNTER — Other Ambulatory Visit: Payer: Self-pay

## 2019-02-24 ENCOUNTER — Telehealth: Payer: Self-pay

## 2019-02-24 NOTE — Telephone Encounter (Signed)
Covid-19 screening questions for appt on 7-22   Do you now or have you had a fever in the last 14 days?  Do you have any respiratory symptoms of shortness of breath or cough now or in the last 14 days?  Do you have any family members or close contacts with diagnosed or suspected Covid-19 in the past 14 days?  Have you been tested for Covid-19 and found to be positive?  PT ANSWERED NO TO ALL QUESTIONS.

## 2019-02-25 ENCOUNTER — Encounter: Payer: Self-pay | Admitting: Gastroenterology

## 2019-02-25 ENCOUNTER — Ambulatory Visit: Payer: BC Managed Care – PPO | Admitting: Gastroenterology

## 2019-02-25 VITALS — BP 110/70 | HR 56 | Temp 98.1°F | Ht 73.5 in | Wt 192.2 lb

## 2019-02-25 DIAGNOSIS — Z1211 Encounter for screening for malignant neoplasm of colon: Secondary | ICD-10-CM | POA: Insufficient documentation

## 2019-02-25 DIAGNOSIS — R194 Change in bowel habit: Secondary | ICD-10-CM | POA: Insufficient documentation

## 2019-02-25 MED ORDER — SUPREP BOWEL PREP KIT 17.5-3.13-1.6 GM/177ML PO SOLN: ORAL | 0 refills | Status: DC

## 2019-02-25 NOTE — Patient Instructions (Signed)
If you are age 53 or older, your body mass index should be between 23-30. Your Body mass index is 25.01 kg/m. If this is out of the aforementioned range listed, please consider follow up with your Primary Care Provider.  If you are age 40 or younger, your body mass index should be between 19-25. Your Body mass index is 25.01 kg/m. If this is out of the aformentioned range listed, please consider follow up with your Primary Care Provider.   To help prevent the possible spread of infection to our patients, communities, and staff; we will be implementing the following measures:  As of now we are not allowing any visitors/family members to accompany you to any upcoming appointments with [facility]. If you have any concerns about this please contact our office to discuss prior to the appointment.   You have been scheduled for a colonoscopy. Please follow written instructions given to you at your visit today.  Please pick up your prep supplies at the pharmacy within the next 1-3 days. If you use inhalers (even only as needed), please bring them with you on the day of your procedure. Your physician has requested that you go to www.startemmi.com and enter the access code given to you at your visit today. This web site gives a general overview about your procedure. However, you should still follow specific instructions given to you by our office regarding your preparation for the procedure.  Thank you for entrusting me with your care and for choosing [employer], Alonza Bogus, [place]

## 2019-02-25 NOTE — Progress Notes (Addendum)
02/25/2019 Cameron Olson [id number] February 24, 1966   HISTORY OF PRESENT ILLNESS: This is a 53 year old male who is new to our office.  He is requesting Dr. Hilarie Fredrickson for his GI physician.  He wants to discuss colonoscopy.  He says that he has been diagnosed with and treated for RA for the past 10 years.  His rheumatologist recommended he have a colonoscopy several years ago, but he just never got around to it.  Recently, due to work and limit on travel for his job, he has been living in a hotel and tells me that his diet has been different.  He is vegan, but says that he has not had access to his normal foods.  He reports mostly difference in stool consistency and color.  Denies seeing blood in his stool.  Denies any constipation or straining.  No abdominal pain.  Never had colonoscopy in the past.  Referred by PCP, Dr. Gerilyn Nestle.   Past Medical History:  Diagnosis Date  . Dilated cardiomyopathy ([place])   . Palpitations    Past Surgical History:  Procedure Laterality Date  . HAND SURGERY      reports that he has never smoked. He has never used smokeless tobacco. He reports that he does not drink alcohol or use drugs. family history includes Diabetes in his mother; Heart disease in his mother; Hypertension in his father. No Known Allergies    Outpatient Encounter Medications as of 02/25/2019  Medication Sig  . Adalimumab (HUMIRA PEN) 40 MG/0.4ML PNKT Inject 40 mg into the muscle as directed. Every 10 days  . carvedilol (COREG) 25 MG tablet Take 1 tablet (25 mg total) by mouth daily.  . carvedilol (COREG) 6.25 MG tablet Take 1 tablet (6.25 mg total) by mouth 2 (two) times daily with a meal. May increase to 6.25 mg daily if needed  . Cholecalciferol (VITAMIN D-1000 MAX ST) 1000 units tablet Take 1 tablet by mouth daily.  . folic acid (FOLVITE) 1 MG tablet Take 1 tablet by mouth daily.  . hydroxychloroquine (PLAQUENIL) 200 MG tablet Take 1 tablet by mouth 2 (two) times daily.   Marland Kitchen  levothyroxine (SYNTHROID) 75 MCG tablet Take 1 tablet by mouth daily.  . methotrexate 2.5 MG tablet Take 10 mg by mouth once a week.  . vitamin B-12 (CYANOCOBALAMIN) 1000 MCG tablet Take 1 tablet by mouth daily.   No facility-administered encounter medications on file as of 02/25/2019.      REVIEW OF SYSTEMS  : All other systems reviewed and negative except where noted in the History of Present Illness.   PHYSICAL EXAM: Ht 6' 1.5" (1.867 m)   Wt 192 lb 3.2 oz (87.2 kg)   BMI 25.01 kg/m  General: Well developed white male in no acute distress Head: Normocephalic and atraumatic Eyes:  Sclerae anicteric, conjunctiva pink. Ears: Normal auditory acuity Lungs: Clear throughout to auscultation; no increased WOB. Heart: Regular rate and rhythm; no M/R/G. Abdomen: Soft, non-distended.  BS present.  Non-tender. Rectal:  Will be done at the time of colonoscopy. Musculoskeletal: Symmetrical with no gross deformities  Skin: No lesions on visible extremities Extremities: No edema  Neurological: Alert oriented x 4, grossly non-focal Psychological:  Alert and cooperative. Normal mood and affect  ASSESSMENT AND PLAN: *CRC screening:  Will schedule for colonoscopy with Dr. Hilarie Fredrickson at the request of the patient. *Change in bowel habits:  More of a change in consistency and color.  Diet has changed a lot recently due to him living in  a hotel and different/increased coffee consumption.  The dietary changes are very likely the cause of his change in bowels.  CC:  Francee GentileZiolkowska, [place], MD  Addendum: Reviewed and agree with assessment and management plan. Pyrtle, Carie CaddyJay M, MD

## 2019-03-03 ENCOUNTER — Other Ambulatory Visit: Payer: Self-pay

## 2019-03-03 ENCOUNTER — Ambulatory Visit (HOSPITAL_BASED_OUTPATIENT_CLINIC_OR_DEPARTMENT_OTHER)
Admission: RE | Admit: 2019-03-03 | Discharge: 2019-03-03 | Disposition: A | Discharge status: Home / Self Care | Payer: BC Managed Care – PPO | Source: Ambulatory Visit | Attending: Cardiology | Admitting: Cardiology

## 2019-03-03 DIAGNOSIS — I420 Dilated cardiomyopathy: Secondary | ICD-10-CM | POA: Diagnosis not present

## 2019-03-03 NOTE — Progress Notes (Signed)
  Echocardiogram 2D Echocardiogram has been performed.  Cameron Olson 03/03/2019, 9:17 AM

## 2019-03-17 ENCOUNTER — Telehealth: Payer: Self-pay | Admitting: Cardiology

## 2019-03-17 NOTE — Telephone Encounter (Signed)
Patient called back and informed of results.  

## 2019-03-17 NOTE — Telephone Encounter (Signed)
Patient got your message about results but has some additional questions.

## 2019-03-17 NOTE — Telephone Encounter (Signed)
Left message for patient to return call.

## 2019-03-18 DIAGNOSIS — M069 Rheumatoid arthritis, unspecified: Secondary | ICD-10-CM | POA: Diagnosis not present

## 2019-03-18 DIAGNOSIS — M25542 Pain in joints of left hand: Secondary | ICD-10-CM | POA: Diagnosis not present

## 2019-03-30 ENCOUNTER — Encounter: Payer: BC Managed Care – PPO | Admitting: Internal Medicine

## 2019-04-11 DIAGNOSIS — Z79899 Other long term (current) drug therapy: Secondary | ICD-10-CM | POA: Diagnosis not present

## 2019-04-11 DIAGNOSIS — M0579 Rheumatoid arthritis with rheumatoid factor of multiple sites without organ or systems involvement: Secondary | ICD-10-CM | POA: Diagnosis not present

## 2019-05-01 DIAGNOSIS — Z23 Encounter for immunization: Secondary | ICD-10-CM | POA: Diagnosis not present

## 2019-05-07 ENCOUNTER — Encounter: Payer: BC Managed Care – PPO | Admitting: Internal Medicine

## 2019-05-13 ENCOUNTER — Telehealth: Payer: Self-pay | Admitting: Internal Medicine

## 2019-05-13 ENCOUNTER — Other Ambulatory Visit: Payer: Self-pay

## 2019-05-13 DIAGNOSIS — Z79899 Other long term (current) drug therapy: Secondary | ICD-10-CM | POA: Diagnosis not present

## 2019-05-13 DIAGNOSIS — R109 Unspecified abdominal pain: Secondary | ICD-10-CM

## 2019-05-13 DIAGNOSIS — M0579 Rheumatoid arthritis with rheumatoid factor of multiple sites without organ or systems involvement: Secondary | ICD-10-CM | POA: Diagnosis not present

## 2019-05-13 NOTE — Telephone Encounter (Signed)
ECL scheduled 05/27/19@4pm . Pt aware and has prep instructions.

## 2019-05-13 NOTE — Telephone Encounter (Signed)
ok 

## 2019-05-13 NOTE — Telephone Encounter (Signed)
Pt states he has started having some discomfort in his abdomen, reports his PCP suggested he also have an EGD. Pt is already scheduled for Colon. Please advise if ok for EGD.

## 2019-05-15 ENCOUNTER — Ambulatory Visit: Payer: BC Managed Care – PPO | Admitting: Cardiology

## 2019-05-21 ENCOUNTER — Encounter: Payer: Self-pay | Admitting: Internal Medicine

## 2019-05-22 ENCOUNTER — Telehealth (INDEPENDENT_AMBULATORY_CARE_PROVIDER_SITE_OTHER): Payer: BC Managed Care – PPO | Admitting: Cardiology

## 2019-05-22 ENCOUNTER — Other Ambulatory Visit: Payer: Self-pay

## 2019-05-22 ENCOUNTER — Encounter: Payer: Self-pay | Admitting: Cardiology

## 2019-05-22 ENCOUNTER — Telehealth: Payer: Self-pay

## 2019-05-22 VITALS — Wt 188.0 lb

## 2019-05-22 DIAGNOSIS — I420 Dilated cardiomyopathy: Secondary | ICD-10-CM

## 2019-05-22 DIAGNOSIS — I10 Essential (primary) hypertension: Secondary | ICD-10-CM

## 2019-05-22 DIAGNOSIS — R002 Palpitations: Secondary | ICD-10-CM

## 2019-05-22 NOTE — Patient Instructions (Signed)
Medication Instructions:  Your physician recommends that you continue on your current medications as directed. Please refer to the Current Medication list given to you today.  *If you need a refill on your cardiac medications before your next appointment, please call your pharmacy*  Lab Work: None Ordered  Testing/Procedures: None Ordered  Follow-Up: At [facility], you and your health needs are our priority.  As part of our continuing mission to provide you with exceptional heart care, we have created designated Provider Care Teams.  These Care Teams include your primary Cardiologist (physician) and Advanced Practice Providers (APPs -  Physician Assistants and Nurse Practitioners) who all work together to provide you with the care you need, when you need it.  Your next appointment:   6 months  The format for your next appointment:   In Person  Provider:   You may see Dr. Agustin Cree or the following Advanced Practice Provider on your designated Care Team:    Laurann Montana, FNP   Other Instructions None

## 2019-05-22 NOTE — Progress Notes (Signed)
Virtual Visit via Video Note   This visit type was conducted due to national recommendations for restrictions regarding the COVID-19 Pandemic (e.g. social distancing) in an effort to limit this patient's exposure and mitigate transmission in our community.  Due to his co-morbid illnesses, this patient is at least at moderate risk for complications without adequate follow up.  This format is felt to be most appropriate for this patient at this time.  All issues noted in this document were discussed and addressed.  A limited physical exam was performed with this format.  Please refer to the patient's chart for his consent to telehealth for [facility].  Evaluation Performed:  Follow-up visit  This visit type was conducted due to national recommendations for restrictions regarding the COVID-19 Pandemic (e.g. social distancing).  This format is felt to be most appropriate for this patient at this time.  All issues noted in this document were discussed and addressed.  No physical exam was performed (except for noted visual exam findings with Video Visits).  Please refer to the patient's chart (MyChart message for video visits and phone note for telephone visits) for the patient's consent to telehealth for [facility].  Date:  05/22/2019  ID: Cameron Olson, DOB [birth year], MRN [id number]   Patient Location: [address] [place] [address]   Provider location:   [place] Office  PCP:  Hermelinda Medicus, MD  Cardiologist:  Jenne Campus, MD     Chief Complaint: Cardiac wise doing fine  History of Present Illness:    Cameron Olson is a 53 y.o. male  who presents via audio/video conferencing for a telehealth visit today.  With history of cardiomyopathy ejection fraction previously mildly diminished but for years of repeated checks of echocardiogram showing preserved left ventricle ejection fraction.  Additional problem that he got from cardiac standpoint review  his palpitations this is controlled with small dose of beta-blocker.  Recent echocardiogram shows enlargement of the aortic root aortic root measuring 3.9 mm prior measurements with 3.6.  He is very concerned about it.  The majority of the problem that he got a coming from different issues she does have advanced a rheumatoid arthritis is very troubling.  Also started having some stomach problem that being aggressively pursued and worked up by internal medicine team and rheumatology team.  He is scheduled to have a gastroscopy soon.  He started exercising back he walks a few kilometers every other day seems to be doing well from that point of view.  Previously he reported some issue with his leg being red and that is much better right now.   The patient does not have symptoms concerning for COVID-19 infection (fever, chills, cough, or new SHORTNESS OF BREATH).    Prior CV studies:   The following studies were reviewed today:       Past Medical History:  Diagnosis Date  . Dilated cardiomyopathy ([place])   . Hyperlipidemia   . Hypothyroidism   . Palpitations   . Rheumatoid arthritis [facility])     Past Surgical History:  Procedure Laterality Date  . HAND SURGERY Left   . HAND TENDON SURGERY Right      Current Meds  Medication Sig  . Adalimumab (HUMIRA PEN) 40 MG/0.4ML PNKT Inject 40 mg into the muscle as directed. Every 10 days  . carvedilol (COREG) 25 MG tablet Take 1 tablet (25 mg total) by mouth daily.  . carvedilol (COREG) 6.25 MG tablet Take 1 tablet (6.25 mg total)  by mouth 2 (two) times daily with a meal. May increase to 6.25 mg daily if needed  . Cholecalciferol (VITAMIN D-1000 MAX ST) 1000 units tablet Take 1 tablet by mouth daily.  . folic acid (FOLVITE) 1 MG tablet Take 1 tablet by mouth daily.  . hydroxychloroquine (PLAQUENIL) 200 MG tablet Take 1 tablet by mouth 2 (two) times daily.   Marland Kitchen levothyroxine (SYNTHROID) 75 MCG tablet Take 1 tablet by mouth daily.  . methotrexate 2.5  MG tablet Take 10 mg by mouth once a week.  Manus Gunning BOWEL PREP KIT 17.5-3.13-1.6 GM/177ML SOLN Suprep-Use as directed  . vitamin B-12 (CYANOCOBALAMIN) 1000 MCG tablet Take 1 tablet by mouth daily.      Family History: The patient's family history includes Diabetes in his mother; Heart disease in his mother; Hypertension in his father; Liver disease in his mother; Pancreatitis in his mother. There is no history of Colon cancer, Colon polyps, Stomach cancer, Esophageal cancer, or Pancreatic cancer.   ROS:   Please see the history of present illness.     All other systems reviewed and are negative.   Labs/Other Tests and Data Reviewed:     Recent Labs: No results found for requested labs within last 8760 hours.  Recent Lipid Panel No results found for: CHOL, TRIG, HDL, CHOLHDL, VLDL, LDLCALC, LDLDIRECT    Exam:    Vital Signs:  Wt 188 lb (85.3 kg)   BMI 24.47 kg/m     Wt Readings from Last 3 Encounters:  05/22/19 188 lb (85.3 kg)  02/25/19 192 lb 3.2 oz (87.2 kg)  02/12/19 185 lb (83.9 kg)     Well nourished, well developed in no acute distress. Alert awake in exam 3 talking to me via video link.  Denies having any issue with not in any distress like it was very pleasant.  Diagnosis for this visit:   1. Dilated cardiomyopathy ([place])   2. Benign essential hypertension   3. Palpitations      ASSESSMENT & PLAN:    1.  History of dilated cardiomyopathy with normalization repeat echocardiogram showed.  On appropriate medication which I will continue Essential hypertension blood pressure control on appropriate medications 3.  Palpitations on beta-blocker which I will continue. 4.  Enlargement of the aorta measuring 39 mm.  We will follow on data on yearly echocardiogram.  COVID-19 Education: The signs and symptoms of COVID-19 were discussed with the patient and how to seek care for testing (follow up with PCP or arrange E-visit).  The importance of social distancing  was discussed today.  Patient Risk:   After full review of this patients clinical status, I feel that they are at least moderate risk at this time.  Time:   Today, I have spent 20 minutes with the patient with telehealth technology discussing pt health issues.  I spent 5 minutes reviewing her chart before the visit.  Visit was finished at 8:41 AM.    Medication Adjustments/Labs and Tests Ordered: Current medicines are reviewed at length with the patient today.  Concerns regarding medicines are outlined above.  No orders of the defined types were placed in this encounter.  Medication changes: No orders of the defined types were placed in this encounter.    Disposition: Follow-up 6 months  Signed, Park Liter, MD, [facility] 05/22/2019 8:39 AM    [place]

## 2019-05-22 NOTE — Telephone Encounter (Signed)
Post virtual phone visit, called and left voice message that Dr. Agustin Cree would like him to have a follow-up appointment in 6 months and that we will put him in recall list and phone 2 months in advance to schedule this appt. Requested return call to ensure he received this information.

## 2019-05-26 ENCOUNTER — Telehealth: Payer: Self-pay

## 2019-05-26 ENCOUNTER — Telehealth: Payer: Self-pay | Admitting: Internal Medicine

## 2019-05-26 NOTE — Telephone Encounter (Signed)
Pt states he has procedure tomorrow and wanted to know what to do if he does not have a BM. Discussed prep with pt and that if he follows the prep instructions he should have a BM. Pt stated he read online that if he stretched the prep out overtime it may work better. Let pt know that he needed to follow the instructions exactly as written to have the best results. He knows how to reach the oncall md if needed.

## 2019-05-26 NOTE — Telephone Encounter (Signed)
Covid-19 screening questions   Do you now or have you had a fever in the last 14 days?  Do you have any respiratory symptoms of shortness of breath or cough now or in the last 14 days?  Do you have any family members or close contacts with diagnosed or suspected Covid-19 in the past 14 days?  Have you been tested for Covid-19 and found to be positive?       

## 2019-05-26 NOTE — Telephone Encounter (Signed)
Patient called back and answered "NO" to all screening questions. °

## 2019-05-27 ENCOUNTER — Encounter: Payer: Self-pay | Admitting: Internal Medicine

## 2019-05-27 ENCOUNTER — Ambulatory Visit (AMBULATORY_SURGERY_CENTER): Payer: BC Managed Care – PPO | Admitting: Internal Medicine

## 2019-05-27 ENCOUNTER — Other Ambulatory Visit: Payer: Self-pay

## 2019-05-27 VITALS — BP 105/48 | HR 46 | Temp 96.8°F | Resp 21 | Ht 73.0 in | Wt 192.0 lb

## 2019-05-27 DIAGNOSIS — R1013 Epigastric pain: Secondary | ICD-10-CM

## 2019-05-27 DIAGNOSIS — K2980 Duodenitis without bleeding: Secondary | ICD-10-CM | POA: Diagnosis not present

## 2019-05-27 DIAGNOSIS — Z1211 Encounter for screening for malignant neoplasm of colon: Secondary | ICD-10-CM

## 2019-05-27 MED ORDER — SODIUM CHLORIDE 0.9 % IV SOLN: 500.0000 mL | Freq: Once | INTRAVENOUS | Status: DC

## 2019-05-27 NOTE — Op Note (Signed)
[place] Patient Name: Cameron Olson Procedure Date: 05/27/2019 4:28 PM MRN: [id number] Endoscopist: Jerene Bears , MD Age: 53 Referring MD:  Date of Birth: [birth year] Gender: Male Account #: [id number] Procedure:                Upper GI endoscopy Indications:              Epigastric abdominal pain Medicines:                Monitored Anesthesia Care Procedure:                Pre-Anesthesia Assessment:                           - Prior to the procedure, a History and Physical                            was performed, and patient medications and                            allergies were reviewed. The patient's tolerance of                            previous anesthesia was also reviewed. The risks                            and benefits of the procedure and the sedation                            options and risks were discussed with the patient.                            All questions were answered, and informed consent                            was obtained. Prior Anticoagulants: The patient has                            taken no previous anticoagulant or antiplatelet                            agents. ASA Grade Assessment: II - A patient with                            mild systemic disease. After reviewing the risks                            and benefits, the patient was deemed in                            satisfactory condition to undergo the procedure.                           After obtaining informed consent, the endoscope was  passed under direct vision. Throughout the                            procedure, the patient's blood pressure, pulse, and                            oxygen saturations were monitored continuously. The                            Endoscope was introduced through the mouth, and                            advanced to the second part of duodenum. The upper                            GI endoscopy was accomplished  without difficulty.                            The patient tolerated the procedure well. Scope In: Scope Out: Findings:                 The examined esophagus was normal.                           Mild inflammation characterized by erythema was                            found in the gastric antrum. Biopsies were taken                            with a cold forceps for histology and Helicobacter                            pylori testing.                           The examined duodenum was normal. Biopsies for                            histology were taken with a cold forceps for                            evaluation of celiac disease. Complications:            No immediate complications. Estimated Blood Loss:     Estimated blood loss was minimal. Impression:               - Normal esophagus.                           - Gastritis. Biopsied.                           - Normal examined duodenum. Biopsied. Recommendation:           - Patient has a contact number available for  emergencies. The signs and symptoms of potential                            delayed complications were discussed with the                            patient. Return to normal activities tomorrow.                            Written discharge instructions were provided to the                            patient.                           - Resume previous diet.                           - Continue present medications.                           - Await pathology results. Beverley Fiedler, MD 05/27/2019 5:15:11 PM This report has been signed electronically.

## 2019-05-27 NOTE — Progress Notes (Signed)
Called to room to assist during endoscopic procedure.  Patient ID and intended procedure confirmed with present staff. Received instructions for my participation in the procedure from the performing physician.  

## 2019-05-27 NOTE — Progress Notes (Signed)
Pt's states no medical or surgical changes since previsit or office visit. 

## 2019-05-27 NOTE — Op Note (Signed)
[place] Patient Name: Cameron Olson Procedure Date: 05/27/2019 4:25 PM MRN: [id number] Endoscopist: Jerene Bears , MD Age: 53 Referring MD:  Date of Birth: [birth year] Gender: Male Account #: [id number] Procedure:                Colonoscopy Indications:              Screening for colorectal malignant neoplasm, This                            is the patient's first colonoscopy Medicines:                Monitored Anesthesia Care Procedure:                Pre-Anesthesia Assessment:                           - Prior to the procedure, a History and Physical                            was performed, and patient medications and                            allergies were reviewed. The patient's tolerance of                            previous anesthesia was also reviewed. The risks                            and benefits of the procedure and the sedation                            options and risks were discussed with the patient.                            All questions were answered, and informed consent                            was obtained. Prior Anticoagulants: The patient has                            taken no previous anticoagulant or antiplatelet                            agents. ASA Grade Assessment: II - A patient with                            mild systemic disease. After reviewing the risks                            and benefits, the patient was deemed in                            satisfactory condition to undergo the procedure.  After obtaining informed consent, the colonoscope                            was passed under direct vision. Throughout the                            procedure, the patient's blood pressure, pulse, and                            oxygen saturations were monitored continuously. The                            Colonoscope was introduced through the anus and                            advanced to the terminal  ileum. The colonoscopy was                            performed without difficulty. The patient tolerated                            the procedure well. The quality of the bowel                            preparation was fair with copious irrigation and                            lavage performed. The terminal ileum, ileocecal                            valve, appendiceal orifice, and rectum were                            photographed. Scope In: 4:47:20 PM Scope Out: 5:07:32 PM Scope Withdrawal Time: 0 hours 15 minutes 57 seconds  Total Procedure Duration: 0 hours 20 minutes 12 seconds  Findings:                 The terminal ileum appeared normal.                           The colon (entire examined portion) appeared normal.                           Internal hemorrhoids were found during                            retroflexion. The hemorrhoids were small. Complications:            No immediate complications. Estimated Blood Loss:     Estimated blood loss: none. Impression:               - Preparation of the colon was fair (predominantly                            in the right colon) despite  copious irrigation and                            lavage.                           - The examined portion of the ileum was normal.                           - The entire examined colon is normal.                           - Small internal hemorrhoids.                           - No specimens collected. Recommendation:           - Patient has a contact number available for                            emergencies. The signs and symptoms of potential                            delayed complications were discussed with the                            patient. Return to normal activities tomorrow.                            Written discharge instructions were provided to the                            patient.                           - Resume previous diet.                           - Continue present  medications.                           - Repeat colonoscopy for screening purposes and                            because the bowel preparation was suboptimal in 1-2                            years is recommended. Beverley FiedlerJay M Kasi Lasky, MD 05/27/2019 5:19:09 PM This report has been signed electronically.

## 2019-05-27 NOTE — Patient Instructions (Signed)
YOU HAD AN ENDOSCOPIC PROCEDURE TODAY AT THE [facility]:   Refer to the procedure report that was given to you for any specific questions about what was found during the examination.  If the procedure report does not answer your questions, please call your gastroenterologist to clarify.  If you requested that your care partner not be given the details of your procedure findings, then the procedure report has been included in a sealed envelope for you to review at your convenience later.  YOU SHOULD EXPECT: Some feelings of bloating in the abdomen. Passage of more gas than usual.  Walking can help get rid of the air that was put into your GI tract during the procedure and reduce the bloating. If you had a lower endoscopy (such as a colonoscopy or flexible sigmoidoscopy) you may notice spotting of blood in your stool or on the toilet paper. If you underwent a bowel prep for your procedure, you may not have a normal bowel movement for a few days.  Please Note:  You might notice some irritation and congestion in your nose or some drainage.  This is from the oxygen used during your procedure.  There is no need for concern and it should clear up in a day or so.  SYMPTOMS TO REPORT IMMEDIATELY:   Following lower endoscopy (colonoscopy or flexible sigmoidoscopy):  Excessive amounts of blood in the stool  Significant tenderness or worsening of abdominal pains  Swelling of the abdomen that is new, acute  Fever of 100F or higher   Following upper endoscopy (EGD)  Vomiting of blood or coffee ground material  New chest pain or pain under the shoulder blades  Painful or persistently difficult swallowing  New shortness of breath  Fever of 100F or higher  Black, tarry-looking stools  For urgent or emergent issues, a gastroenterologist can be reached at any hour by calling [phone].   DIET:  We do recommend a small meal at first, but then you may proceed to your regular diet.  Drink  plenty of fluids but you should avoid alcoholic beverages for 24 hours.  ACTIVITY:  You should plan to take it easy for the rest of today and you should NOT DRIVE or use heavy machinery until tomorrow (because of the sedation medicines used during the test).    FOLLOW UP: Our staff will call the number listed on your records 48-72 hours following your procedure to check on you and address any questions or concerns that you may have regarding the information given to you following your procedure. If we do not reach you, we will leave a message.  We will attempt to reach you two times.  During this call, we will ask if you have developed any symptoms of COVID 19. If you develop any symptoms (ie: fever, flu-like symptoms, shortness of breath, cough etc.) before then, please call [phone].  If you test positive for Covid 19 in the 2 weeks post procedure, please call and report this information to us.    If any biopsies were taken you will be contacted by phone or by letter within the next 1-3 weeks.  Please call us at [phone] if you have not heard about the biopsies in 3 weeks.    SIGNATURES/CONFIDENTIALITY: You and/or your care partner have signed paperwork which will be entered into your electronic medical record.  These signatures attest to the fact that that the information above on your After Visit Summary has been reviewed and is   understood.  Full responsibility of the confidentiality of this discharge information lies with you and/or your care-partner. 

## 2019-05-29 ENCOUNTER — Telehealth: Payer: Self-pay | Admitting: *Deleted

## 2019-05-29 NOTE — Telephone Encounter (Signed)
  Follow up Call-  Call back number 05/27/2019  Post procedure Call Back phone  # [phone]  Permission to leave phone message Yes  Some recent data might be hidden     Patient questions:  Do you have a fever, pain , or abdominal swelling? No. Pain Score  0 *  Have you tolerated food without any problems? Yes.    Have you been able to return to your normal activities? Yes.    Do you have any questions about your discharge instructions: Diet   No. Medications  No. Follow up visit  No.  Do you have questions or concerns about your Care? No.  Actions: * If pain score is 4 or above: No action needed, pain <4.  1. Have you developed a fever since your procedure? no  2.   Have you had an respiratory symptoms (SOB or cough) since your procedure? no  3.   Have you tested positive for COVID 19 since your procedure no  4.   Have you had any family members/close contacts diagnosed with the COVID 19 since your procedure?  no   If yes to any of these questions please route to Joylene John, RN and Alphonsa Gin, Therapist, sports. '

## 2019-05-29 NOTE — Telephone Encounter (Signed)
  Follow up Call-  Call back number 05/27/2019  Post procedure Call Back phone  # [phone]  Permission to leave phone message Yes  Some recent data might be hidden     Patient questions:  Message left to call if necessary.

## 2019-06-04 ENCOUNTER — Encounter: Payer: BC Managed Care – PPO | Admitting: Internal Medicine

## 2019-06-04 ENCOUNTER — Encounter: Payer: Self-pay | Admitting: Internal Medicine

## 2019-06-05 ENCOUNTER — Telehealth: Payer: Self-pay | Admitting: Internal Medicine

## 2019-06-05 ENCOUNTER — Other Ambulatory Visit: Payer: Self-pay

## 2019-06-05 MED ORDER — PANTOPRAZOLE SODIUM 40 MG PO TBEC: 40.0000 mg | DELAYED_RELEASE_TABLET | Freq: Every day | ORAL | 3 refills | Status: DC

## 2019-06-05 NOTE — Telephone Encounter (Signed)
Pt inquired about results of EGD and colon.

## 2019-06-05 NOTE — Telephone Encounter (Signed)
Spoke with pt regarding results, see result letter. Script sent to pharmacy for protonix as results letter states. Pt aware.

## 2019-06-06 DIAGNOSIS — M0579 Rheumatoid arthritis with rheumatoid factor of multiple sites without organ or systems involvement: Secondary | ICD-10-CM | POA: Diagnosis not present

## 2019-06-06 DIAGNOSIS — Z79899 Other long term (current) drug therapy: Secondary | ICD-10-CM | POA: Diagnosis not present

## 2019-07-23 ENCOUNTER — Telehealth: Payer: Self-pay | Admitting: Internal Medicine

## 2019-07-23 ENCOUNTER — Other Ambulatory Visit: Payer: Self-pay

## 2019-07-23 NOTE — Telephone Encounter (Signed)
Left message for pt to call back  °

## 2019-07-23 NOTE — Telephone Encounter (Signed)
Pt reports he is still having issues with stomach pressure and pain that he was having prior to the EGD. States the protonix helped some and he is trying to eat all alkaline foods but he still has the pain and pressure. Please advise.

## 2019-07-24 ENCOUNTER — Other Ambulatory Visit: Payer: Self-pay | Admitting: Cardiology

## 2019-07-24 NOTE — Telephone Encounter (Signed)
Pt called back and reported the same symptoms.  Dr. Hilarie Fredrickson is out of office today.  Please advise.

## 2019-07-28 NOTE — Telephone Encounter (Signed)
Would proceed with complete abd [place]

## 2019-07-30 ENCOUNTER — Telehealth: Payer: Self-pay | Admitting: Internal Medicine

## 2019-07-30 NOTE — Telephone Encounter (Signed)
Spoke with pt and he is aware of recommendation. At this time he does not want to schedule [place] due to covid concerns. Pt will call back if he decides he wants to schedule [place].

## 2019-07-30 NOTE — Telephone Encounter (Signed)
See additional phone note. 

## 2019-07-30 NOTE — Telephone Encounter (Signed)
Pls call pt, he would like to speak with you. He did not give more details.

## 2019-08-01 DIAGNOSIS — M0579 Rheumatoid arthritis with rheumatoid factor of multiple sites without organ or systems involvement: Secondary | ICD-10-CM | POA: Diagnosis not present

## 2019-08-01 DIAGNOSIS — Z79899 Other long term (current) drug therapy: Secondary | ICD-10-CM | POA: Diagnosis not present

## 2019-08-12 ENCOUNTER — Telehealth: Payer: Self-pay | Admitting: Internal Medicine

## 2019-08-12 NOTE — Telephone Encounter (Signed)
Patient indicates that his rheumatologist has changed his medication to Harriette Ohara recently and he wanted to make sure this was okay to take along with pantoprazole. I advised him that this should not be a problem.

## 2019-08-12 NOTE — Telephone Encounter (Signed)
Pt stated that there is a change in medication prescribed by PCP and would like to know whether there would be drug interactions with pantoprazole.

## 2019-08-27 ENCOUNTER — Telehealth: Payer: Self-pay | Admitting: Internal Medicine

## 2019-08-27 MED ORDER — FAMOTIDINE 20 MG PO TABS: 20.0000 mg | ORAL_TABLET | Freq: Two times a day (BID) | ORAL | 3 refills | Status: DC

## 2019-08-27 NOTE — Telephone Encounter (Signed)
Left detailed message for pt per his request. Script sent to pharamcy. Pt aware.

## 2019-08-27 NOTE — Telephone Encounter (Signed)
Substitute famotidine 20 mg twice daily (roughly 12 hrs apart) ; stop pantoprazole for now

## 2019-08-27 NOTE — Telephone Encounter (Signed)
Pt reported that he has been taking pantoprazole for 11 weeks and has been experiencing constipation, severe insomnia and 2-kg weight gain.  Pt is concerned and would like to discuss switching to another medication.  He stated OK to leave a detailed voicemail.

## 2019-08-27 NOTE — Telephone Encounter (Signed)
Please see note below and advise what med you would like to switch him to.

## 2019-08-28 DIAGNOSIS — E291 Testicular hypofunction: Secondary | ICD-10-CM | POA: Diagnosis not present

## 2019-08-28 NOTE — Telephone Encounter (Signed)
Pt called and states that before he had his EGD he took Pepcid ac for a month and a half and it did not help. He also tried omeprazole for 2 weeks prior to EGD and it did not help. After his EGD he was given protonix and he has taken it now for 3 mth with no improvement and has had some side effects listed in note below. Pt was instructed to stop the protonix yesterday and to take pepcid 20mg  bid. He reports pepcid ac did not help. He is asking if there is anything else he can take. Please advise.

## 2019-08-31 ENCOUNTER — Other Ambulatory Visit: Payer: Self-pay

## 2019-08-31 MED ORDER — LANSOPRAZOLE 30 MG PO CPDR: 30.0000 mg | DELAYED_RELEASE_CAPSULE | Freq: Every day | ORAL | 3 refills | Status: DC

## 2019-08-31 NOTE — Telephone Encounter (Signed)
Pt called regarding this message.

## 2019-08-31 NOTE — Telephone Encounter (Signed)
Spoke with pt and he is aware, script sent to pharmacy. 

## 2019-08-31 NOTE — Telephone Encounter (Signed)
Ok to try changing pantoprazole to lansoprazole at 30 mg daily (best 30 min before breakfast)

## 2019-08-31 NOTE — Telephone Encounter (Signed)
Pt calling back about message below, please advise.

## 2019-09-03 ENCOUNTER — Other Ambulatory Visit: Payer: Self-pay

## 2019-09-03 ENCOUNTER — Telehealth: Payer: Self-pay | Admitting: Internal Medicine

## 2019-09-03 ENCOUNTER — Encounter: Payer: Self-pay | Admitting: Emergency Medicine

## 2019-09-03 DIAGNOSIS — R109 Unspecified abdominal pain: Secondary | ICD-10-CM

## 2019-09-03 NOTE — Telephone Encounter (Signed)
Pls call pt, he states that he spoke with you about getting an appt for imaging at a imaging location that is on a first floor so he does not have to use the elevator. Pt is trying to reduce exposure to Covid. He also said that he needs the last appt. of the day. Pls call him.

## 2019-09-03 NOTE — Telephone Encounter (Signed)
Pt scheduled for ABD [place] at [facility] 09/08/19 @10am . Pt to be NPO after midnight. Pt aware of appt.

## 2019-09-07 DIAGNOSIS — E291 Testicular hypofunction: Secondary | ICD-10-CM | POA: Diagnosis not present

## 2019-09-08 ENCOUNTER — Ambulatory Visit
Admission: RE | Admit: 2019-09-08 | Discharge: 2019-09-08 | Disposition: A | Discharge status: Home / Self Care | Payer: BC Managed Care – PPO | Source: Ambulatory Visit | Attending: Internal Medicine | Admitting: Internal Medicine

## 2019-09-08 ENCOUNTER — Other Ambulatory Visit: Payer: BC Managed Care – PPO

## 2019-09-08 DIAGNOSIS — K8689 Other specified diseases of pancreas: Secondary | ICD-10-CM | POA: Diagnosis not present

## 2019-09-08 DIAGNOSIS — R109 Unspecified abdominal pain: Secondary | ICD-10-CM

## 2019-09-12 ENCOUNTER — Ambulatory Visit: Payer: BC Managed Care – PPO

## 2019-09-26 DIAGNOSIS — Z79899 Other long term (current) drug therapy: Secondary | ICD-10-CM | POA: Diagnosis not present

## 2019-09-26 DIAGNOSIS — M0579 Rheumatoid arthritis with rheumatoid factor of multiple sites without organ or systems involvement: Secondary | ICD-10-CM | POA: Diagnosis not present

## 2019-09-26 DIAGNOSIS — I10 Essential (primary) hypertension: Secondary | ICD-10-CM | POA: Diagnosis not present

## 2019-09-30 DIAGNOSIS — Z79899 Other long term (current) drug therapy: Secondary | ICD-10-CM | POA: Diagnosis not present

## 2019-09-30 DIAGNOSIS — M0579 Rheumatoid arthritis with rheumatoid factor of multiple sites without organ or systems involvement: Secondary | ICD-10-CM | POA: Diagnosis not present

## 2019-10-05 ENCOUNTER — Telehealth: Payer: Self-pay | Admitting: Internal Medicine

## 2019-10-05 ENCOUNTER — Other Ambulatory Visit: Payer: Self-pay

## 2019-10-05 DIAGNOSIS — R109 Unspecified abdominal pain: Secondary | ICD-10-CM

## 2019-10-05 NOTE — Telephone Encounter (Signed)
CBC, CMP CT scan abd pelvis with IV contrast to eval upper abdominal pain Previous EGD/US

## 2019-10-05 NOTE — Telephone Encounter (Signed)
Lab orders in epic. Pt scheduled for CT of A/P at [facility] CT 10/13/19@2pm , pt to arrive there at 1:45pm. Pt to be NPO after 10am, drink bottle 1 of contrast at 12noon, bottle 2 at 1pm. Pt to pick up contrast and have labs prior to CT appt. Left message for pt to call back.  Pt called back and wants to wait until after his second covid vaccine to have the CT done. Pt given the phone number [phone] to reschedule the CT to a date after his second vaccine. Contrast left up front for pt to pick up.

## 2019-10-05 NOTE — Telephone Encounter (Signed)
Pt states he is still having abdominal discomfort. He would like to proceed with CT scan. Please advise if you want CT of abd and pelvis.

## 2019-10-13 ENCOUNTER — Inpatient Hospital Stay: Admission: RE | Admit: 2019-10-13 | Payer: BC Managed Care – PPO | Source: Ambulatory Visit

## 2019-10-28 DIAGNOSIS — Z20822 Contact with and (suspected) exposure to covid-19: Secondary | ICD-10-CM | POA: Diagnosis not present

## 2019-10-29 ENCOUNTER — Other Ambulatory Visit (INDEPENDENT_AMBULATORY_CARE_PROVIDER_SITE_OTHER): Payer: BC Managed Care – PPO

## 2019-10-29 DIAGNOSIS — R109 Unspecified abdominal pain: Secondary | ICD-10-CM | POA: Diagnosis not present

## 2019-10-29 LAB — CBC WITH DIFFERENTIAL/PLATELET
Basophils Absolute: 0.1 10*3/uL (ref 0.0–0.1)
Basophils Relative: 1.2 % (ref 0.0–3.0)
Eosinophils Absolute: 0.3 10*3/uL (ref 0.0–0.7)
Eosinophils Relative: 4.4 % (ref 0.0–5.0)
HCT: 44.8 % (ref 39.0–52.0)
Hemoglobin: 15.6 g/dL (ref 13.0–17.0)
Lymphocytes Relative: 38.6 % (ref 12.0–46.0)
Lymphs Abs: 2.3 10*3/uL (ref 0.7–4.0)
MCHC: 34.9 g/dL (ref 30.0–36.0)
MCV: 91.5 fl (ref 78.0–100.0)
Monocytes Absolute: 0.6 10*3/uL (ref 0.1–1.0)
Monocytes Relative: 9.8 % (ref 3.0–12.0)
Neutro Abs: 2.7 10*3/uL (ref 1.4–7.7)
Neutrophils Relative %: 46 % (ref 43.0–77.0)
Platelets: 196 10*3/uL (ref 150.0–400.0)
RBC: 4.9 Mil/uL (ref 4.22–5.81)
RDW: 13.2 % (ref 11.5–15.5)
WBC: 5.9 10*3/uL (ref 4.0–10.5)

## 2019-10-29 LAB — COMPREHENSIVE METABOLIC PANEL
ALT: 18 U/L (ref 0–53)
AST: 18 U/L (ref 0–37)
Albumin: 4.6 g/dL (ref 3.5–5.2)
Alkaline Phosphatase: 46 U/L (ref 39–117)
BUN: 14 mg/dL (ref 6–23)
CO2: 34 mEq/L — ABNORMAL HIGH (ref 19–32)
Calcium: 9.1 mg/dL (ref 8.4–10.5)
Chloride: 101 mEq/L (ref 96–112)
Creatinine, Ser: 0.84 mg/dL (ref 0.40–1.50)
GFR: 95.36 mL/min (ref >60.00)
Glucose, Bld: 78 mg/dL (ref 70–99)
Potassium: 4.1 mEq/L (ref 3.5–5.1)
Sodium: 138 mEq/L (ref 135–145)
Total Bilirubin: 1 mg/dL (ref 0.2–1.2)
Total Protein: 7.3 g/dL (ref 6.0–8.3)

## 2019-11-02 ENCOUNTER — Ambulatory Visit (INDEPENDENT_AMBULATORY_CARE_PROVIDER_SITE_OTHER)
Admission: RE | Admit: 2019-11-02 | Discharge: 2019-11-02 | Disposition: A | Discharge status: Home / Self Care | Payer: BC Managed Care – PPO | Source: Ambulatory Visit | Attending: Internal Medicine | Admitting: Internal Medicine

## 2019-11-02 ENCOUNTER — Other Ambulatory Visit: Payer: Self-pay

## 2019-11-02 DIAGNOSIS — R109 Unspecified abdominal pain: Secondary | ICD-10-CM

## 2019-11-02 MED ORDER — IOHEXOL 300 MG/ML  SOLN
100.0000 mL | Freq: Once | INTRAMUSCULAR | Status: AC | PRN
  Administered 2019-11-02: 100 mL via INTRAVENOUS

## 2019-11-03 DIAGNOSIS — Z20822 Contact with and (suspected) exposure to covid-19: Secondary | ICD-10-CM | POA: Diagnosis not present

## 2019-11-19 ENCOUNTER — Other Ambulatory Visit: Payer: Self-pay | Admitting: Cardiology

## 2021-02-08 DIAGNOSIS — M0579 Rheumatoid arthritis with rheumatoid factor of multiple sites without organ or systems involvement: Secondary | ICD-10-CM | POA: Diagnosis not present

## 2021-02-08 DIAGNOSIS — Z79899 Other long term (current) drug therapy: Secondary | ICD-10-CM | POA: Diagnosis not present

## 2021-02-17 DIAGNOSIS — M0579 Rheumatoid arthritis with rheumatoid factor of multiple sites without organ or systems involvement: Secondary | ICD-10-CM | POA: Diagnosis not present

## 2021-02-17 DIAGNOSIS — Z79899 Other long term (current) drug therapy: Secondary | ICD-10-CM | POA: Diagnosis not present

## 2021-03-08 DIAGNOSIS — Z79899 Other long term (current) drug therapy: Secondary | ICD-10-CM | POA: Diagnosis not present

## 2021-04-21 DIAGNOSIS — M0579 Rheumatoid arthritis with rheumatoid factor of multiple sites without organ or systems involvement: Secondary | ICD-10-CM | POA: Diagnosis not present

## 2021-04-21 DIAGNOSIS — Z79899 Other long term (current) drug therapy: Secondary | ICD-10-CM | POA: Diagnosis not present

## 2021-05-16 ENCOUNTER — Telehealth: Payer: Self-pay | Admitting: Emergency Medicine

## 2021-05-16 DIAGNOSIS — E039 Hypothyroidism, unspecified: Secondary | ICD-10-CM

## 2021-05-16 DIAGNOSIS — I420 Dilated cardiomyopathy: Secondary | ICD-10-CM

## 2021-05-16 NOTE — Telephone Encounter (Signed)
Called patient per Dr. Bing Matter to order TSH, CMP, CBC. Also patient needs a follow up with Dr. Bing Matter. Left message for patient to return call.

## 2021-05-22 DIAGNOSIS — E039 Hypothyroidism, unspecified: Secondary | ICD-10-CM | POA: Diagnosis not present

## 2021-05-22 DIAGNOSIS — I420 Dilated cardiomyopathy: Secondary | ICD-10-CM | POA: Diagnosis not present

## 2021-05-23 LAB — COMPREHENSIVE METABOLIC PANEL
ALT: 21 IU/L (ref 0–44)
AST: 26 IU/L (ref 0–40)
Albumin/Globulin Ratio: 2.3 — ABNORMAL HIGH (ref 1.2–2.2)
Albumin: 4.9 g/dL (ref 3.8–4.9)
Alkaline Phosphatase: 50 IU/L (ref 44–121)
BUN/Creatinine Ratio: 12 (ref 9–20)
BUN: 10 mg/dL (ref 6–24)
Bilirubin Total: 0.4 mg/dL (ref 0.0–1.2)
CO2: 26 mmol/L (ref 20–29)
Calcium: 9.6 mg/dL (ref 8.7–10.2)
Chloride: 102 mmol/L (ref 96–106)
Creatinine, Ser: 0.86 mg/dL (ref 0.76–1.27)
Globulin, Total: 2.1 g/dL (ref 1.5–4.5)
Glucose: 85 mg/dL (ref 70–99)
Potassium: 4.5 mmol/L (ref 3.5–5.2)
Sodium: 140 mmol/L (ref 134–144)
Total Protein: 7 g/dL (ref 6.0–8.5)
eGFR: 102 mL/min/{1.73_m2} (ref >59)

## 2021-05-23 LAB — CBC
Hematocrit: 44.6 % (ref 37.5–51.0)
Hemoglobin: 15.7 g/dL (ref 13.0–17.7)
MCH: 32.5 pg (ref 26.6–33.0)
MCHC: 35.2 g/dL (ref 31.5–35.7)
MCV: 92 fL (ref 79–97)
Platelets: 226 10*3/uL (ref 150–450)
RBC: 4.83 x10E6/uL (ref 4.14–5.80)
RDW: 12.2 % (ref 11.6–15.4)
WBC: 5.1 10*3/uL (ref 3.4–10.8)

## 2021-05-23 LAB — TSH: TSH: 1.72 u[IU]/mL (ref 0.450–4.500)

## 2021-05-29 DIAGNOSIS — Z79899 Other long term (current) drug therapy: Secondary | ICD-10-CM | POA: Diagnosis not present

## 2021-05-29 DIAGNOSIS — M0579 Rheumatoid arthritis with rheumatoid factor of multiple sites without organ or systems involvement: Secondary | ICD-10-CM | POA: Diagnosis not present

## 2021-05-30 DIAGNOSIS — Z79899 Other long term (current) drug therapy: Secondary | ICD-10-CM | POA: Diagnosis not present

## 2021-05-30 DIAGNOSIS — M0579 Rheumatoid arthritis with rheumatoid factor of multiple sites without organ or systems involvement: Secondary | ICD-10-CM | POA: Diagnosis not present

## 2021-06-06 ENCOUNTER — Ambulatory Visit: Payer: BC Managed Care – PPO | Attending: Internal Medicine

## 2021-06-06 ENCOUNTER — Other Ambulatory Visit (HOSPITAL_BASED_OUTPATIENT_CLINIC_OR_DEPARTMENT_OTHER): Payer: Self-pay

## 2021-06-06 DIAGNOSIS — Z23 Encounter for immunization: Secondary | ICD-10-CM

## 2021-06-06 MED ORDER — INFLUENZA VAC SPLIT QUAD 0.5 ML IM SUSY
PREFILLED_SYRINGE | INTRAMUSCULAR | 0 refills | Status: DC
  Filled 2021-06-06: qty 0.5, 1d supply, fill #0

## 2021-06-06 NOTE — Progress Notes (Signed)
   Covid-19 Vaccination Clinic  Name:  Jonathen Rathman    MRN: [id number] DOB: 07/16/1966  06/06/2021  Mr. Hoffmann was observed post Covid-19 immunization for 15 minutes without incident. He was provided with Vaccine Information Sheet and instruction to access the V-Safe system.   Mr. Burkland was instructed to call 911 with any severe reactions post vaccine: Difficulty breathing  Swelling of face and throat  A fast heartbeat  A bad rash all over body  Dizziness and weakness   Immunizations Administered     Name Date Dose VIS Date Route   Moderna Covid-19 vaccine Bivalent Booster 06/06/2021  9:29 AM 0.5 mL 03/18/2021 Intramuscular   Manufacturer: Moderna   Lot: [id number]   NDC: 23343-568-61

## 2021-06-07 ENCOUNTER — Ambulatory Visit: Payer: BC Managed Care – PPO | Admitting: Cardiology

## 2021-06-07 ENCOUNTER — Encounter: Payer: Self-pay | Admitting: Cardiology

## 2021-06-07 ENCOUNTER — Other Ambulatory Visit: Payer: Self-pay

## 2021-06-07 VITALS — BP 100/62 | HR 55 | Ht 73.0 in | Wt 204.0 lb

## 2021-06-07 DIAGNOSIS — M0579 Rheumatoid arthritis with rheumatoid factor of multiple sites without organ or systems involvement: Secondary | ICD-10-CM

## 2021-06-07 DIAGNOSIS — I420 Dilated cardiomyopathy: Secondary | ICD-10-CM

## 2021-06-07 DIAGNOSIS — E039 Hypothyroidism, unspecified: Secondary | ICD-10-CM | POA: Diagnosis not present

## 2021-06-07 MED ORDER — LEVOTHYROXINE SODIUM 75 MCG PO TABS: 75.0000 ug | ORAL_TABLET | Freq: Every day | ORAL | 2 refills | Status: AC

## 2021-06-07 NOTE — Progress Notes (Signed)
Cardiology Office Note:    Date:  06/07/2021   ID:  Stefani Dama, DOB Jul 04, 1966, MRN [id number]  PCP:  Francee Gentile, MD  Cardiologist:  Gypsy Balsam, MD    Referring MD: Francee Gentile, MD   Chief Complaint  Patient presents with   Follow-up  Doing fine but have a problem rheumatoid arthritis  History of Present Illness:    Cameron Olson is a 55 y.o. male with past medical history significant for dilated cardiomyopathy newly diagnosed many years ago however since that time repeated echocardiogram showing preserved left ventricle ejection fraction, she also have enlargement of the ascending aorta measuring 39 mm, rheumatoid arthritis which is quite severe, hypothyroidism.  He comes today to my office for follow-up.  Overall he is doing well.  His palpitations are under control rheumatoid arthritis again problems he tried all different medication with limited success lately.  Past Medical History:  Diagnosis Date   Dilated cardiomyopathy (HCC)    Hyperlipidemia    Hypothyroidism    Palpitations    Rheumatoid arthritis (HCC)    Rheumatoid arthritis    Past Surgical History:  Procedure Laterality Date   HAND SURGERY Left    HAND TENDON SURGERY Right     Current Medications: Current Meds  Medication Sig   carvedilol (COREG) 25 MG tablet TAKE 1 TABLET DAILY   carvedilol (COREG) 6.25 MG tablet TAKE 1 TABLET TWICE A DAY   Cholecalciferol 25 MCG (1000 UT) tablet Take 1 tablet by mouth daily.   folic acid (FOLVITE) 1 MG tablet Take 1 tablet by mouth daily.   hydroxychloroquine (PLAQUENIL) 200 MG tablet Take 1 tablet by mouth 2 (two) times daily.    influenza vac split quadrivalent PF (FLUARIX) 0.5 ML injection Inject into the muscle.   levothyroxine (SYNTHROID) 75 MCG tablet Take 1 tablet by mouth daily.   methotrexate 2.5 MG tablet Take 10 mg by mouth once a week.   Tofacitinib Citrate (XELJANZ PO) Take 1 tablet by mouth daily.   vitamin B-12 (CYANOCOBALAMIN)  1000 MCG tablet Take 1 tablet by mouth daily.     Allergies:   Patient has no known allergies.   Social History   Socioeconomic History   Marital status: Significant Other    Spouse name: Not on file   Number of children: Not on file   Years of education: Not on file   Highest education level: Not on file  Occupational History   Not on file  Tobacco Use   Smoking status: Never   Smokeless tobacco: Never  Vaping Use   Vaping Use: Never used  Substance and Sexual Activity   Alcohol use: Never   Drug use: Never   Sexual activity: Not on file  Other Topics Concern   Not on file  Social History Narrative   Not on file   Social Determinants of Health   Financial Resource Strain: Not on file  Food Insecurity: Not on file  Transportation Needs: Not on file  Physical Activity: Not on file  Stress: Not on file  Social Connections: Not on file     Family History: The patient's family history includes Diabetes in his mother; Heart disease in his mother; Hypertension in his father; Liver disease in his mother; Pancreatitis in his mother. There is no history of Colon cancer, Colon polyps, Stomach cancer, Esophageal cancer, Pancreatic cancer, or Rectal cancer. ROS:   Please see the history of present illness.    All 14 point review of systems negative except  as described per history of present illness  EKGs/Labs/Other Studies Reviewed:      Recent Labs: 05/22/2021: ALT 21; BUN 10; Creatinine, Ser 0.86; Hemoglobin 15.7; Platelets 226; Potassium 4.5; Sodium 140; TSH 1.720  Recent Lipid Panel No results found for: CHOL, TRIG, HDL, CHOLHDL, VLDL, LDLCALC, LDLDIRECT  Physical Exam:    VS:  BP 100/62   Pulse (!) 55   Ht 6\' 1"  (1.854 m)   Wt 204 lb (92.5 kg)   SpO2 97%   BMI 26.91 kg/m     Wt Readings from Last 3 Encounters:  06/07/21 204 lb (92.5 kg)  05/27/19 192 lb (87.1 kg)  05/22/19 188 lb (85.3 kg)     GEN:  Well nourished, well developed in no acute  distress HEENT: Normal NECK: No JVD; No carotid bruits LYMPHATICS: No lymphadenopathy CARDIAC: RRR, no murmurs, no rubs, no gallops RESPIRATORY:  Clear to auscultation without rales, wheezing or rhonchi  ABDOMEN: Soft, non-tender, non-distended MUSCULOSKELETAL:  No edema; No deformity  SKIN: Warm and dry LOWER EXTREMITIES: no swelling NEUROLOGIC:  Alert and oriented x 3 PSYCHIATRIC:  Normal affect   ASSESSMENT:    1. Dilated cardiomyopathy (HCC)   2. Acquired hypothyroidism   3. Rheumatoid arthritis involving multiple sites with positive rheumatoid factor (HCC)    PLAN:    In order of problems listed above:  Dilated cardiomyopathy I will repeat echocardiogram mostly to look at the size of the aorta and however echocardiogram need to be done also to recheck left ventricle ejection fraction.  He is on beta-blocker which I will continue Palpitations seems to be under control continue present dose of carvedilol. Hypothyroidism we will send him prescription for Synthroid. Rheumatoid arthritis followed by rheumatologist.    Medication Adjustments/Labs and Tests Ordered: Current medicines are reviewed at length with the patient today.  Concerns regarding medicines are outlined above.  Orders Placed This Encounter  Procedures   ECHOCARDIOGRAM COMPLETE   Medication changes: No orders of the defined types were placed in this encounter.   Signed, 05/24/19, MD, [facility] 06/07/2021 3:45 PM    [facility]

## 2021-06-07 NOTE — Patient Instructions (Signed)

## 2021-06-09 ENCOUNTER — Telehealth: Payer: Self-pay | Admitting: Cardiology

## 2021-06-09 MED ORDER — LEVOTHYROXINE SODIUM 75 MCG PO TABS: 75.0000 ug | ORAL_TABLET | Freq: Every day | ORAL | 0 refills | Status: DC

## 2021-06-09 NOTE — Telephone Encounter (Signed)
*  STAT* If patient is at the pharmacy, call can be transferred to refill team.   1. Which medications need to be refilled? (please list name of each medication and dose if known)  levothyroxine (SYNTHROID) 75 MCG tablet  2. Which pharmacy/location (including street and city if local pharmacy) is medication to be sent to? [facility] - [address] [place], [place] [address]  3. Do they need a 30 day or 90 day supply?   Patient states his order from [facility] will not arrive for at least another week. He would like to know if he can have a 10 day temporary supply sent to his local pharmacy, listed above. He states he took his last tablet this morning.

## 2021-06-09 NOTE — Telephone Encounter (Signed)
Medication filled.  

## 2021-06-26 ENCOUNTER — Other Ambulatory Visit (HOSPITAL_BASED_OUTPATIENT_CLINIC_OR_DEPARTMENT_OTHER): Payer: Self-pay

## 2021-06-26 MED ORDER — MODERNA COVID-19 BIVAL BOOSTER 50 MCG/0.5ML IM SUSP
INTRAMUSCULAR | 0 refills | Status: DC
  Filled 2021-06-26: qty 0.5, 1d supply, fill #0

## 2021-07-12 ENCOUNTER — Ambulatory Visit (HOSPITAL_BASED_OUTPATIENT_CLINIC_OR_DEPARTMENT_OTHER): Payer: BC Managed Care – PPO

## 2021-07-20 DIAGNOSIS — M0579 Rheumatoid arthritis with rheumatoid factor of multiple sites without organ or systems involvement: Secondary | ICD-10-CM | POA: Diagnosis not present

## 2021-07-20 DIAGNOSIS — Z79899 Other long term (current) drug therapy: Secondary | ICD-10-CM | POA: Diagnosis not present

## 2021-07-25 DIAGNOSIS — Z79631 Long term (current) use of antimetabolite agent: Secondary | ICD-10-CM | POA: Diagnosis not present

## 2021-07-25 DIAGNOSIS — Z79899 Other long term (current) drug therapy: Secondary | ICD-10-CM | POA: Diagnosis not present

## 2021-07-25 DIAGNOSIS — M059 Rheumatoid arthritis with rheumatoid factor, unspecified: Secondary | ICD-10-CM | POA: Diagnosis not present

## 2021-07-25 DIAGNOSIS — Z7952 Long term (current) use of systemic steroids: Secondary | ICD-10-CM | POA: Diagnosis not present

## 2021-07-27 IMAGING — US US ABDOMEN COMPLETE
1 series · 14 of 25 positions shown · non-contrast
Comparison: None.

CLINICAL DATA: Abdominal pressure and pain

EXAM:
ABDOMEN ULTRASOUND COMPLETE

[Series 1: us abdomen complete · 0.17mm/px · 14 of 77 slices shown]
[im 1/77]
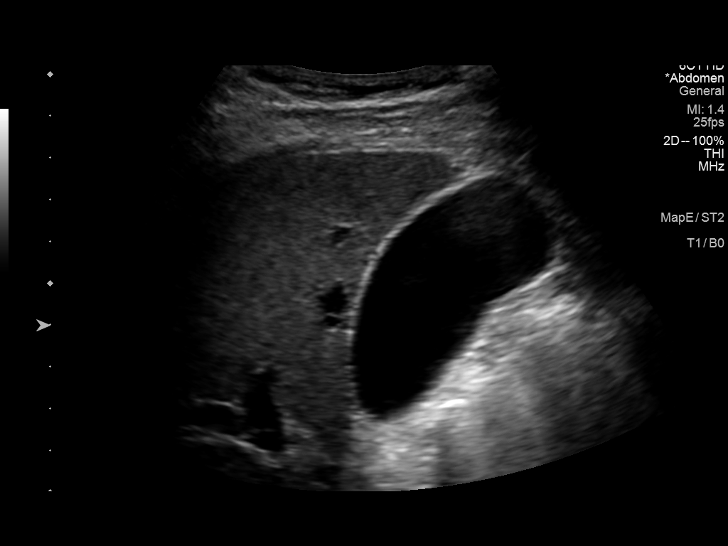
[im 7/77]
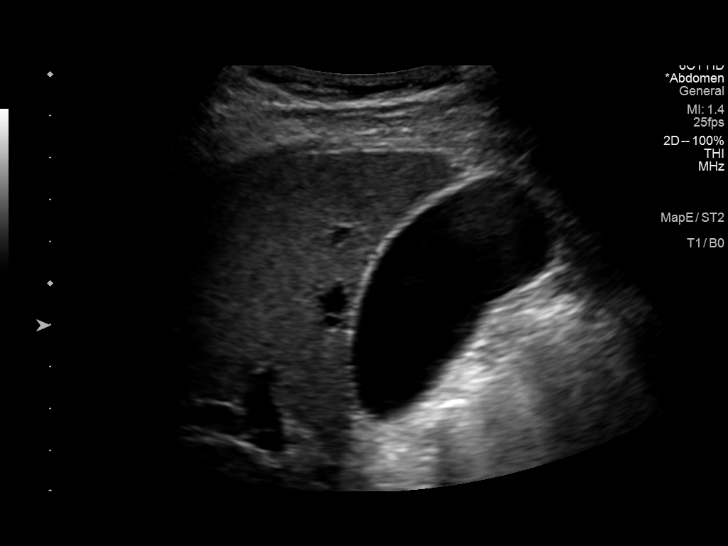
[im 13/77]
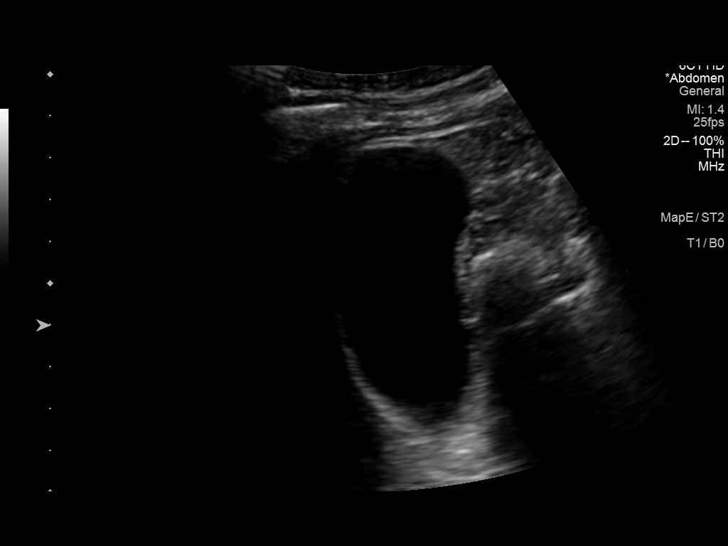
[im 20/77]
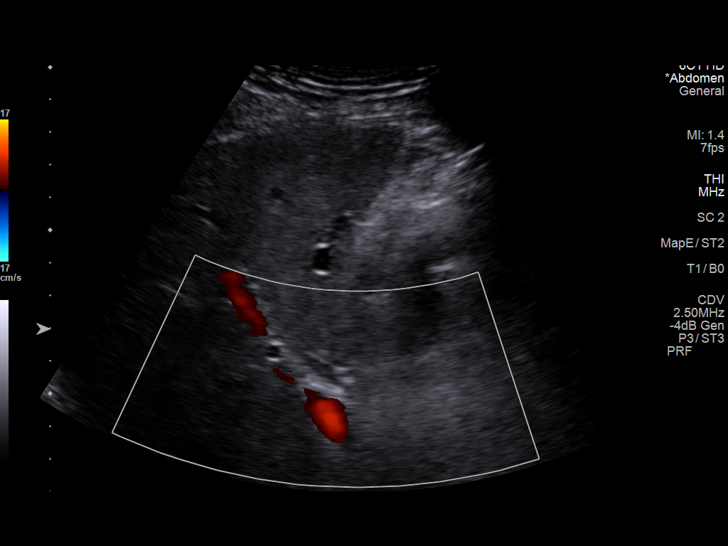
[im 26/77]
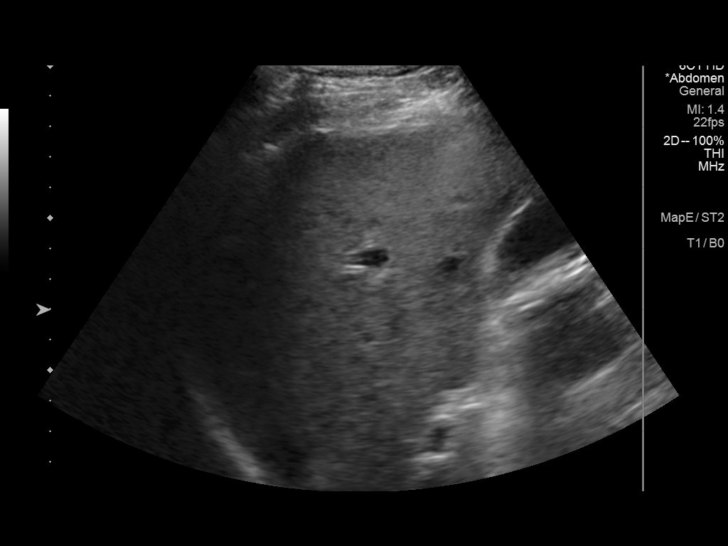
[im 29/77]
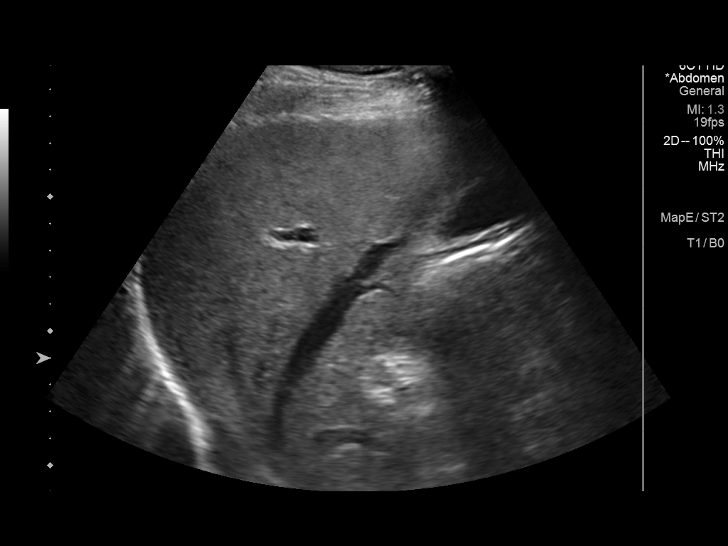
[im 35/77]
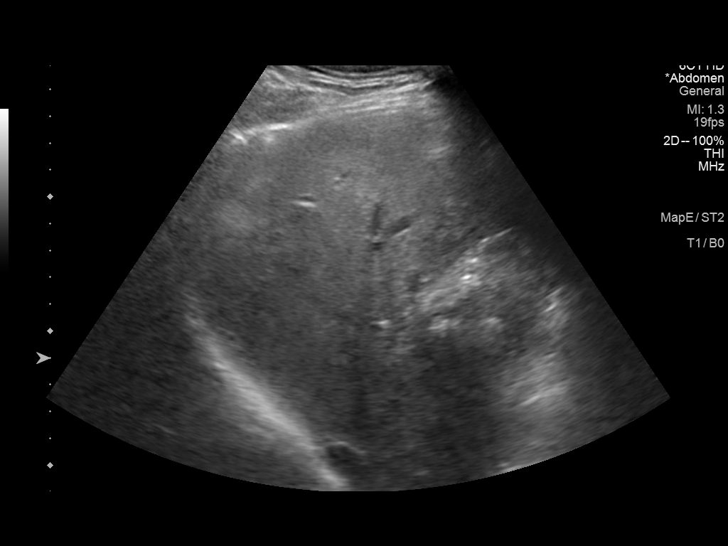
[im 42/77]
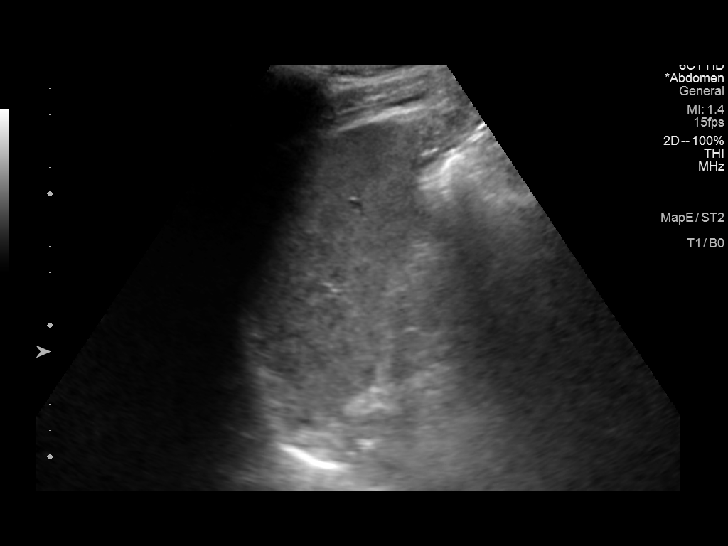
[im 48/77]
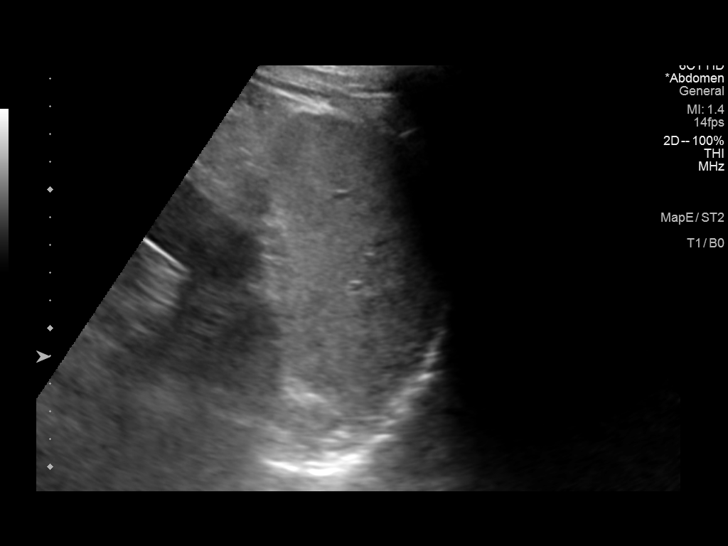
[im 51/77]
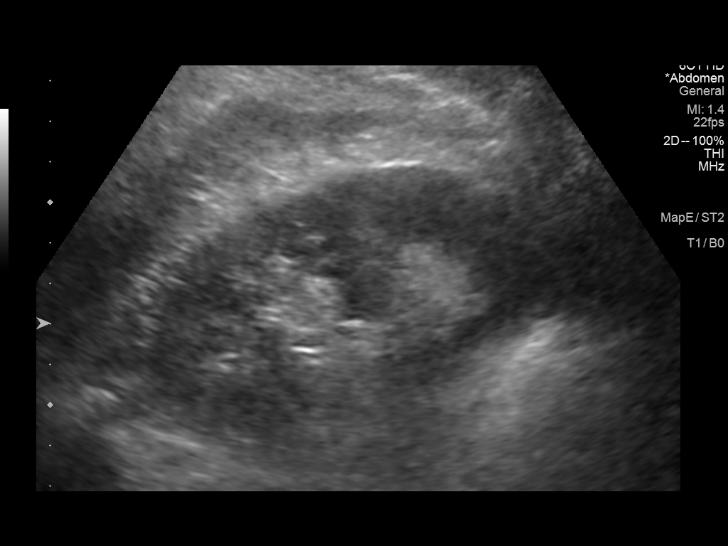
[im 58/77]
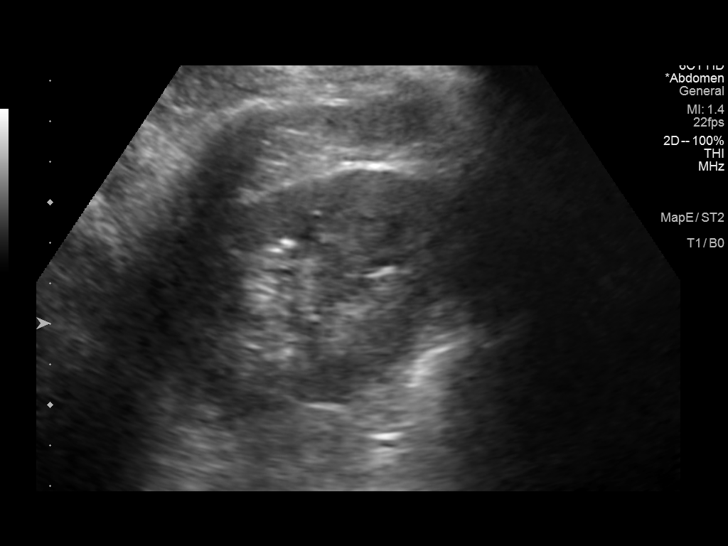
[im 64/77]
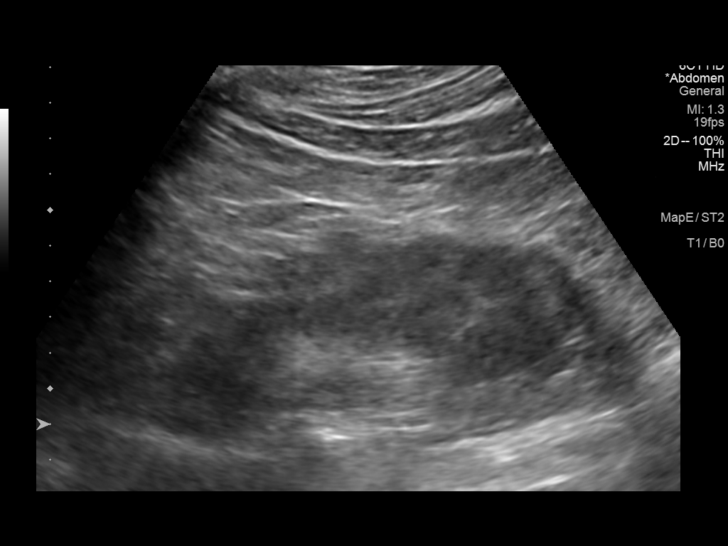
[im 70/77]
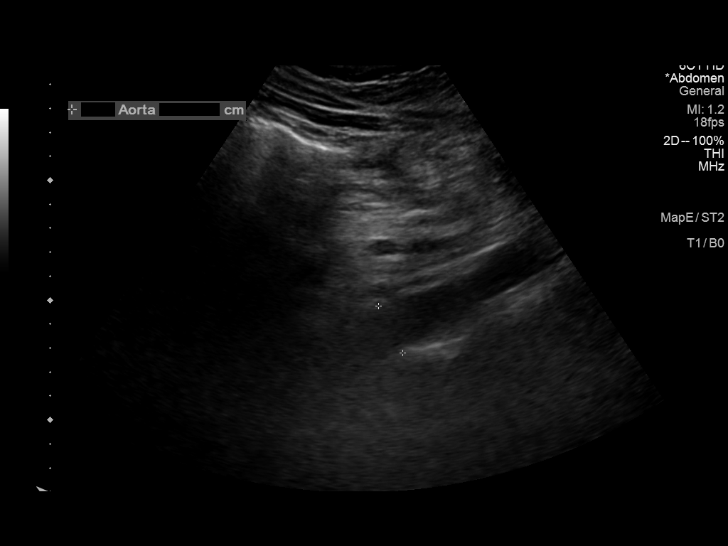
[im 77/77]
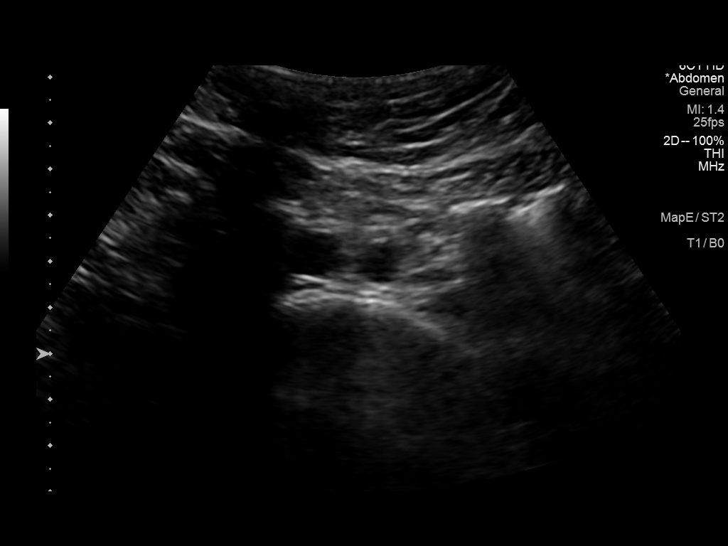

[14 of 25 positions shown; findings below may reference images not displayed]

FINDINGS: Gallbladder: No gallstones or wall thickening visualized. No
sonographic Murphy sign noted by sonographer.

Common bile duct: Diameter: 5 mm

Liver: No focal lesion identified. Within normal limits in
parenchymal echogenicity. Portal vein is patent on color Doppler
imaging with normal direction of blood flow towards the liver.

IVC: No abnormality visualized.

Pancreas: Bowel gas limits evaluation of the pancreas.

Spleen: Size and appearance within normal limits.

Right Kidney: Length: 10.1. Echogenicity within normal limits. No
mass or hydronephrosis visualized.

Left Kidney: Length: 12.2. Echogenicity within normal limits. No
mass or hydronephrosis visualized.

Abdominal aorta: No aneurysm visualized.

Other findings: None.
IMPRESSION: 1. Obscuration of the pancreas by bowel gas.
2. Otherwise unremarkable exam.

## 2021-08-10 DIAGNOSIS — U071 COVID-19: Secondary | ICD-10-CM | POA: Diagnosis not present

## 2021-08-14 ENCOUNTER — Ambulatory Visit (HOSPITAL_BASED_OUTPATIENT_CLINIC_OR_DEPARTMENT_OTHER): Payer: BC Managed Care – PPO

## 2021-08-15 ENCOUNTER — Telehealth: Payer: Self-pay | Admitting: Cardiology

## 2021-08-15 MED ORDER — CARVEDILOL 6.25 MG PO TABS: 6.2500 mg | ORAL_TABLET | Freq: Two times a day (BID) | ORAL | 3 refills | Status: DC

## 2021-08-15 MED ORDER — CARVEDILOL 25 MG PO TABS: 25.0000 mg | ORAL_TABLET | Freq: Every day | ORAL | 3 refills | Status: DC

## 2021-08-15 NOTE — Telephone Encounter (Signed)
°*  STAT* If patient is at the pharmacy, call can be transferred to refill team.   1. Which medications need to be refilled? (please list name of each medication and dose if known)  carvedilol (COREG) 25 MG tablet carvedilol (COREG) 6.25 MG tablet  2. Which pharmacy/location (including street and city if local pharmacy) is medication to be sent to? [facility] [place], [place]  3. Do they need a 30 day or 90 day supply? 90 day

## 2021-08-15 NOTE — Telephone Encounter (Signed)
Refill sent in per request.  

## 2021-09-01 ENCOUNTER — Ambulatory Visit (HOSPITAL_BASED_OUTPATIENT_CLINIC_OR_DEPARTMENT_OTHER)
Admission: RE | Admit: 2021-09-01 | Discharge: 2021-09-01 | Disposition: A | Discharge status: Home / Self Care | Payer: BC Managed Care – PPO | Source: Ambulatory Visit | Attending: Cardiology | Admitting: Cardiology

## 2021-09-01 ENCOUNTER — Other Ambulatory Visit: Payer: Self-pay

## 2021-09-01 DIAGNOSIS — E039 Hypothyroidism, unspecified: Secondary | ICD-10-CM | POA: Diagnosis not present

## 2021-09-01 DIAGNOSIS — M0579 Rheumatoid arthritis with rheumatoid factor of multiple sites without organ or systems involvement: Secondary | ICD-10-CM | POA: Insufficient documentation

## 2021-09-01 DIAGNOSIS — I420 Dilated cardiomyopathy: Secondary | ICD-10-CM | POA: Insufficient documentation

## 2021-09-01 LAB — ECHOCARDIOGRAM COMPLETE
AR max vel: 3.26 cm2
AV Area VTI: 3.11 cm2
AV Area mean vel: 2.95 cm2
AV Mean grad: 3 mmHg
AV Peak grad: 5.3 mmHg
Ao pk vel: 1.15 m/s
Area-P 1/2: 3.08 cm2
S' Lateral: 3.2 cm

## 2021-09-01 NOTE — Progress Notes (Signed)
°  Echocardiogram 2D Echocardiogram has been performed.  Cameron Olson F 09/01/2021, 11:17 AM

## 2021-09-21 DIAGNOSIS — Z125 Encounter for screening for malignant neoplasm of prostate: Secondary | ICD-10-CM | POA: Diagnosis not present

## 2021-09-21 DIAGNOSIS — N529 Male erectile dysfunction, unspecified: Secondary | ICD-10-CM | POA: Diagnosis not present

## 2021-09-21 DIAGNOSIS — E291 Testicular hypofunction: Secondary | ICD-10-CM | POA: Diagnosis not present

## 2021-09-22 ENCOUNTER — Ambulatory Visit (AMBULATORY_SURGERY_CENTER): Payer: BC Managed Care – PPO | Admitting: *Deleted

## 2021-09-22 ENCOUNTER — Other Ambulatory Visit: Payer: Self-pay

## 2021-09-22 VITALS — Ht 74.0 in | Wt 197.0 lb

## 2021-09-22 DIAGNOSIS — Z1211 Encounter for screening for malignant neoplasm of colon: Secondary | ICD-10-CM

## 2021-09-22 MED ORDER — NA SULFATE-K SULFATE-MG SULF 17.5-3.13-1.6 GM/177ML PO SOLN: 2.0000 | Freq: Once | ORAL | 0 refills | Status: AC

## 2021-09-22 NOTE — Progress Notes (Signed)
No egg or soy allergy known to patient  No issues known to pt with past sedation with any surgeries or procedures Patient denies ever being told they had issues or difficulty with intubation  No FH of Malignant Hyperthermia Pt is not on diet pills Pt is not on  home 02  Pt is not on blood thinners  Pt denies issues with constipation  No A fib or A flutter  Pt is fully vaccinated  for Covid  understanding   Due to the COVID-19 pandemic we are asking patients to follow certain guidelines in PV and the [facility]   Pt aware of COVID protocols and [facility] guidelines   PV completed over the phone. Pt verified name, DOB, address and insurance during PV today.  Pt mailed instruction packet with copy of consent form to read and not return, and instructions.  Pt encouraged to call with questions or issues.  If pt has My chart, procedure instructions sent via My Chart

## 2021-09-23 DIAGNOSIS — Z79899 Other long term (current) drug therapy: Secondary | ICD-10-CM | POA: Diagnosis not present

## 2021-09-23 DIAGNOSIS — M0579 Rheumatoid arthritis with rheumatoid factor of multiple sites without organ or systems involvement: Secondary | ICD-10-CM | POA: Diagnosis not present

## 2021-10-02 ENCOUNTER — Telehealth: Payer: Self-pay | Admitting: Internal Medicine

## 2021-10-02 DIAGNOSIS — M0579 Rheumatoid arthritis with rheumatoid factor of multiple sites without organ or systems involvement: Secondary | ICD-10-CM | POA: Diagnosis not present

## 2021-10-02 DIAGNOSIS — E039 Hypothyroidism, unspecified: Secondary | ICD-10-CM | POA: Diagnosis not present

## 2021-10-02 DIAGNOSIS — Z79899 Other long term (current) drug therapy: Secondary | ICD-10-CM | POA: Diagnosis not present

## 2021-10-02 NOTE — Telephone Encounter (Signed)
Hey Dr. Rhea Belton,   Patient called in to cancel procedure 3/2 due to testing positive for COVID. Rescheduled for 4/21.   Thank you

## 2021-10-04 ENCOUNTER — Encounter: Payer: Self-pay | Admitting: Internal Medicine

## 2021-10-05 ENCOUNTER — Encounter: Payer: BC Managed Care – PPO | Admitting: Internal Medicine

## 2021-11-24 ENCOUNTER — Ambulatory Visit (AMBULATORY_SURGERY_CENTER): Payer: BC Managed Care – PPO | Admitting: Internal Medicine

## 2021-11-24 ENCOUNTER — Encounter: Payer: Self-pay | Admitting: Internal Medicine

## 2021-11-24 VITALS — BP 96/70 | HR 47 | Temp 96.2°F | Resp 11 | Ht 73.0 in | Wt 197.0 lb

## 2021-11-24 DIAGNOSIS — Z1211 Encounter for screening for malignant neoplasm of colon: Secondary | ICD-10-CM

## 2021-11-24 MED ORDER — SODIUM CHLORIDE 0.9 % IV SOLN: 500.0000 mL | Freq: Once | INTRAVENOUS | Status: DC

## 2021-11-24 NOTE — Patient Instructions (Signed)
Handout was given to your care partner on hemorrhoids. ?Resume your current medications today. ?Repeat colonoscopy in 10 years for screening purposes. ?Please call if any questions or concerns. ? ? ? ?YOU HAD AN ENDOSCOPIC PROCEDURE TODAY AT THE [facility]:   Refer to the procedure report that was given to you for any specific questions about what was found during the examination.  If the procedure report does not answer your questions, please call your gastroenterologist to clarify.  If you requested that your care partner not be given the details of your procedure findings, then the procedure report has been included in a sealed envelope for you to review at your convenience later. ? ?YOU SHOULD EXPECT: Some feelings of bloating in the abdomen. Passage of more gas than usual.  Walking can help get rid of the air that was put into your GI tract during the procedure and reduce the bloating. If you had a lower endoscopy (such as a colonoscopy or flexible sigmoidoscopy) you may notice spotting of blood in your stool or on the toilet paper. If you underwent a bowel prep for your procedure, you may not have a normal bowel movement for a few days. ? ?Please Note:  You might notice some irritation and congestion in your nose or some drainage.  This is from the oxygen used during your procedure.  There is no need for concern and it should clear up in a day or so. ? ?SYMPTOMS TO REPORT IMMEDIATELY: ? ?Following lower endoscopy (colonoscopy or flexible sigmoidoscopy): ? Excessive amounts of blood in the stool ? Significant tenderness or worsening of abdominal pains ? Swelling of the abdomen that is new, acute ? Fever of 100?F or higher ? ? ?For urgent or emergent issues, a gastroenterologist can be reached at any hour by calling [phone]. ?Do not use MyChart messaging for urgent concerns.  ? ? ?DIET:  We do recommend a small meal at first, but then you may proceed to your regular diet.  Drink plenty of  fluids but you should avoid alcoholic beverages for 24 hours. ? ?ACTIVITY:  You should plan to take it easy for the rest of today and you should NOT DRIVE or use heavy machinery until tomorrow (because of the sedation medicines used during the test).   ? ?FOLLOW UP: ?Our staff will call the number listed on your records 48-72 hours following your procedure to check on you and address any questions or concerns that you may have regarding the information given to you following your procedure. If we do not reach you, we will leave a message.  We will attempt to reach you two times.  During this call, we will ask if you have developed any symptoms of COVID 19. If you develop any symptoms (ie: fever, flu-like symptoms, shortness of breath, cough etc.) before then, please call [phone].  If you test positive for Covid 19 in the 2 weeks post procedure, please call and report this information to [place].   ? ?If any biopsies were taken you will be contacted by phone or by letter within the next 1-3 weeks.  Please call us at [phone] if you have not heard about the biopsies in 3 weeks.  ? ? ?SIGNATURES/CONFIDENTIALITY: ?You and/or your care partner have signed paperwork which will be entered into your electronic medical record.  These signatures attest to the fact that that the information above on your After Visit Summary has been reviewed and is understood.  Full responsibility  of the confidentiality of this discharge information lies with you and/or your care-partner.  ? ? ? ? ? ?  ?

## 2021-11-24 NOTE — Op Note (Signed)
[facility] ?Patient Name: Cameron Olson ?Procedure Date: 11/24/2021 1:59 PM ?MRN: [id number] ?Endoscopist: Beverley Fiedler , MD ?Age: 56 ?Referring MD:  ?Date of Birth: [birth year] ?Gender: Male ?Account #: [id number] ?Procedure:                Colonoscopy ?Indications:              Screening for colorectal malignant neoplasm, Last  ?                          colonoscopy: October 2020 (fair prep which on that  ?                          day interfered with complete visualization) ?Medicines:                Monitored Anesthesia Care ?Procedure:                Pre-Anesthesia Assessment: ?                          - Prior to the procedure, a History and Physical  ?                          was performed, and patient medications and  ?                          allergies were reviewed. The patient's tolerance of  ?                          previous anesthesia was also reviewed. The risks  ?                          and benefits of the procedure and the sedation  ?                          options and risks were discussed with the patient.  ?                          All questions were answered, and informed consent  ?                          was obtained. Prior Anticoagulants: The patient has  ?                          taken no previous anticoagulant or antiplatelet  ?                          agents. ASA Grade Assessment: II - A patient with  ?                          mild systemic disease. After reviewing the risks  ?                          and benefits, the patient was deemed in  ?  satisfactory condition to undergo the procedure. ?                          After obtaining informed consent, the colonoscope  ?                          was passed under direct vision. Throughout the  ?                          procedure, the patient's blood pressure, pulse, and  ?                          oxygen saturations were monitored continuously. The  ?                          CF [id number] #[id number]  was introduced through the anus  ?                          and advanced to the cecum, identified by  ?                          appendiceal orifice and ileocecal valve. The  ?                          colonoscopy was performed without difficulty. The  ?                          patient tolerated the procedure well. The quality  ?                          of the bowel preparation was good. The ileocecal  ?                          valve, appendiceal orifice, and rectum were  ?                          photographed. ?Scope In: 2:08:20 PM ?Scope Out: 2:28:49 PM ?Scope Withdrawal Time: 0 hours 17 minutes 13 seconds  ?Total Procedure Duration: 0 hours 20 minutes 29 seconds  ?Findings:                 The digital rectal exam was normal. ?                          The colon (entire examined portion) appeared normal. ?                          Internal hemorrhoids were found during  ?                          retroflexion. The hemorrhoids were small. ?Complications:            No immediate complications. ?Estimated Blood Loss:     Estimated blood loss: none. ?Impression:               - The entire examined colon is normal. ?                          -  Small internal hemorrhoids. ?                          - No specimens collected. ?Recommendation:           - Patient has a contact number available for  ?                          emergencies. The signs and symptoms of potential  ?                          delayed complications were discussed with the  ?                          patient. Return to normal activities tomorrow.  ?                          Written discharge instructions were provided to the  ?                          patient. ?                          - Resume previous diet. ?                          - Continue present medications. ?                          - Repeat colonoscopy in 10 years for screening  ?                          purposes. ?Beverley FiedlerJay M Tashianna Broome, MD ?11/24/2021 2:41:48 PM ?This report has been signed  electronically. ?

## 2021-11-24 NOTE — Progress Notes (Signed)
Pt's states no medical or surgical changes since previsit or office visit. 

## 2021-11-24 NOTE — Progress Notes (Signed)
? ?GASTROENTEROLOGY PROCEDURE H&P NOTE  ? ?Primary Care Physician: ?Hermelinda Medicus, MD ? ? ? ?Reason for Procedure:  Colon cancer screening, fair prep October 2020 ? ?Plan:    Colonoscopy ? ?Patient is appropriate for endoscopic procedure(s) in the ambulatory ([place]) setting. ? ?The nature of the procedure, as well as the risks, benefits, and alternatives were carefully and thoroughly reviewed with the patient. Ample time for discussion and questions allowed. The patient understood, was satisfied, and agreed to proceed.  ? ? ? ?HPI: ?Cameron Olson is a 56 y.o. male who presents for colonoscopy.  Medical history as below.  Tolerated the prep.  No recent chest pain or shortness of breath.  No abdominal pain today. ? ?Past Medical History:  ?Diagnosis Date  ? Dilated cardiomyopathy ([place])   ? Hyperlipidemia   ? Hypothyroidism   ? Palpitations   ? Rheumatoid arthritis ([place])   ? Rheumatoid arthritis  ? ? ?Past Surgical History:  ?Procedure Laterality Date  ? HAND SURGERY Left   ? HAND TENDON SURGERY Right   ? ? ?Prior to Admission medications   ?Medication Sig Start Date End Date Taking? Authorizing Provider  ?carvedilol (COREG) 6.25 MG tablet Take 1 tablet (6.25 mg total) by mouth 2 (two) times daily. 08/15/21  Yes Park Liter, MD  ?Cholecalciferol 25 MCG (1000 UT) tablet Take 1 tablet by mouth daily.   Yes [provider]  ?folic acid (FOLVITE) 1 MG tablet Take 1 tablet by mouth daily. 03/30/16  Yes [provider]  ?hydroxychloroquine (PLAQUENIL) 200 MG tablet Take 1 tablet by mouth 2 (two) times daily.  07/10/17  Yes [provider]  ?levothyroxine (SYNTHROID) 75 MCG tablet Take 1 tablet (75 mcg total) by mouth daily before breakfast. 06/07/21  Yes Park Liter, MD  ?testosterone (ANDROGEL) 50 MG/5GM (1%) GEL SMARTSIG:1 Topical Daily 09/21/21  Yes [provider]  ?Tofacitinib Citrate (XELJANZ PO) Take 1 tablet by mouth daily.   Yes [provider]  ?vitamin  B-12 (CYANOCOBALAMIN) 1000 MCG tablet Take 1 tablet by mouth daily.   Yes [provider]  ?carvedilol (COREG) 25 MG tablet Take 1 tablet (25 mg total) by mouth daily. 08/15/21   Park Liter, MD  ?COVID-19 mRNA bivalent vaccine, Moderna, (MODERNA COVID-19 BIVAL BOOSTER) 50 MCG/0.5ML injection Inject into the muscle. ?Patient not taking: Reported on 09/22/2021 06/06/21   Carlyle Basques, MD  ?influenza vac split quadrivalent PF (FLUARIX) 0.5 ML injection Inject into the muscle. ?Patient not taking: Reported on 09/22/2021 06/06/21   Carlyle Basques, MD  ?methotrexate [facility]) 2.5 MG tablet Take by mouth. 02/23/21   [provider]  ?predniSONE (DELTASONE) 5 MG tablet Take 5 mg by mouth daily. ?Patient not taking: Reported on 11/24/2021 09/01/21   [provider]  ? ? ?Current Outpatient Medications  ?Medication Sig Dispense Refill  ? carvedilol (COREG) 6.25 MG tablet Take 1 tablet (6.25 mg total) by mouth 2 (two) times daily. 180 tablet 3  ? Cholecalciferol 25 MCG (1000 UT) tablet Take 1 tablet by mouth daily.    ? folic acid (FOLVITE) 1 MG tablet Take 1 tablet by mouth daily.    ? hydroxychloroquine (PLAQUENIL) 200 MG tablet Take 1 tablet by mouth 2 (two) times daily.     ? levothyroxine (SYNTHROID) 75 MCG tablet Take 1 tablet (75 mcg total) by mouth daily before breakfast. 90 tablet 2  ? testosterone (ANDROGEL) 50 MG/5GM (1%) GEL SMARTSIG:1 Topical Daily    ? Tofacitinib Citrate (XELJANZ PO)  Take 1 tablet by mouth daily.    ? vitamin B-12 (CYANOCOBALAMIN) 1000 MCG tablet Take 1 tablet by mouth daily.    ? carvedilol (COREG) 25 MG tablet Take 1 tablet (25 mg total) by mouth daily. 90 tablet 3  ? COVID-19 mRNA bivalent vaccine, Moderna, (MODERNA COVID-19 BIVAL BOOSTER) 50 MCG/0.5ML injection Inject into the muscle. (Patient not taking: Reported on 09/22/2021) 0.5 mL 0  ? influenza vac split quadrivalent PF (FLUARIX) 0.5 ML injection Inject into the muscle. (Patient not taking: Reported on  09/22/2021) 0.5 mL 0  ? methotrexate (RHEUMATREX) 2.5 MG tablet Take by mouth.    ? predniSONE (DELTASONE) 5 MG tablet Take 5 mg by mouth daily. (Patient not taking: Reported on 11/24/2021)    ? ?Current Facility-Administered Medications  ?Medication Dose Route Frequency Provider Last Rate Last Admin  ? 0.9 %  sodium chloride infusion  500 mL Intravenous Once Nikea Settle, Lajuan Lines, MD      ? ? ?Allergies as of 11/24/2021  ? (No Known Allergies)  ? ? ?Family History  ?Problem Relation Age of Onset  ? Heart disease Mother   ? Diabetes Mother   ? Liver disease Mother   ? Pancreatitis Mother   ? Hypertension Father   ? Colon cancer Neg Hx   ? Colon polyps Neg Hx   ? Stomach cancer Neg Hx   ? Esophageal cancer Neg Hx   ? Pancreatic cancer Neg Hx   ? Rectal cancer Neg Hx   ? ? ?Social History  ? ?Socioeconomic History  ? Marital status: Significant Other  ?  Spouse name: Not on file  ? Number of children: Not on file  ? Years of education: Not on file  ? Highest education level: Not on file  ?Occupational History  ? Not on file  ?Tobacco Use  ? Smoking status: Never  ? Smokeless tobacco: Never  ?Vaping Use  ? Vaping Use: Never used  ?Substance and Sexual Activity  ? Alcohol use: Never  ?  Comment: rarely  ? Drug use: Never  ? Sexual activity: Not on file  ?Other Topics Concern  ? Not on file  ?Social History Narrative  ? Not on file  ? ?Social Determinants of Health  ? ?Financial Resource Strain: Not on file  ?Food Insecurity: Not on file  ?Transportation Needs: Not on file  ?Physical Activity: Not on file  ?Stress: Not on file  ?Social Connections: Not on file  ?Intimate Partner Violence: Not on file  ? ? ?Physical Exam: ?Vital signs in last 24 hours: ?@BP  (!) 89/62   Pulse (!) 51   Temp (!) 96.2 ?F (35.7 ?C)   Ht 6\' 1"  (1.854 m)   Wt 197 lb (89.4 kg)   SpO2 98%   BMI 25.99 kg/m?  ?GEN: NAD ?EYE: Sclerae anicteric ?ENT: MMM ?CV: Non-tachycardic ?Pulm: CTA b/l ?GI: Soft, NT/ND ?NEURO:  Alert & Oriented x 3 ? ? ?Zenovia Jarred,  MD ?[facility] ? ?11/24/2021 1:55 PM ? ?

## 2021-11-24 NOTE — Progress Notes (Signed)
No problems noted in the recovery room. maw 

## 2021-11-24 NOTE — Progress Notes (Signed)
Report to PACU RN; VSS. ?

## 2021-11-25 DIAGNOSIS — Z79899 Other long term (current) drug therapy: Secondary | ICD-10-CM | POA: Diagnosis not present

## 2021-11-25 DIAGNOSIS — M0579 Rheumatoid arthritis with rheumatoid factor of multiple sites without organ or systems involvement: Secondary | ICD-10-CM | POA: Diagnosis not present

## 2021-11-28 ENCOUNTER — Telehealth: Payer: Self-pay

## 2021-11-28 NOTE — Telephone Encounter (Signed)
?  Follow up Call- ? ? ?  11/24/2021  ?  1:31 PM 05/27/2019  ?  3:11 PM  ?Call back number  ?Post procedure Call Back phone  # [phone]  ?Permission to leave phone message Yes Yes  ?  ? ?Patient questions: ? ?Do you have a fever, pain , or abdominal swelling? No. ?Pain Score  0 * ? ?Have you tolerated food without any problems? Yes.   ? ?Have you been able to return to your normal activities? Yes.   ? ?Do you have any questions about your discharge instructions: ?Diet   No. ?Medications  No. ?Follow up visit  No. ? ?Do you have questions or concerns about your Care? No. ? ?Actions: ?* If pain score is 4 or above: ?No action needed, pain <4. ? ? ?

## 2021-12-27 ENCOUNTER — Encounter: Payer: Self-pay | Admitting: Cardiology

## 2021-12-27 ENCOUNTER — Ambulatory Visit: Payer: BC Managed Care – PPO | Admitting: Cardiology

## 2021-12-27 VITALS — BP 124/76 | HR 74 | Ht 74.0 in | Wt 201.0 lb

## 2021-12-27 DIAGNOSIS — I420 Dilated cardiomyopathy: Secondary | ICD-10-CM | POA: Diagnosis not present

## 2021-12-27 DIAGNOSIS — R002 Palpitations: Secondary | ICD-10-CM

## 2021-12-27 DIAGNOSIS — M0579 Rheumatoid arthritis with rheumatoid factor of multiple sites without organ or systems involvement: Secondary | ICD-10-CM

## 2021-12-27 DIAGNOSIS — I10 Essential (primary) hypertension: Secondary | ICD-10-CM | POA: Diagnosis not present

## 2021-12-27 NOTE — Patient Instructions (Signed)

## 2021-12-27 NOTE — Progress Notes (Signed)
Cardiology Office Note:    Date:  12/27/2021   ID:  Cameron Olson, DOB 1966/05/20, MRN [id number]  PCP:  Francee Gentile, MD  Cardiologist:  Gypsy Balsam, MD    Referring MD: Francee Gentile, MD   Chief Complaint  Patient presents with   Follow-up    History of Present Illness:    Cameron Olson is a 56 y.o. male past medical history significant for cardiomyopathy with mildly diminished ejection fraction diagnosed many years ago however since that time repeated echocardiogram showing normal left ventricle ejection fraction.  There was also some ectopy.  He does have rheumatoid arthritis and multiple pains in joints.  Hypothyroidism.  Comes today to my office for follow-up overall doing well.  Slow down the limited with exercise but still trying to exercise on the regular basis.  Denies have any chest pain tightness squeezing pressure burning chest no palpitations dizziness swelling of lower extremities.  Past Medical History:  Diagnosis Date   Dilated cardiomyopathy (HCC)    Hyperlipidemia    Hypothyroidism    Palpitations    Rheumatoid arthritis (HCC)    Rheumatoid arthritis    Past Surgical History:  Procedure Laterality Date   HAND SURGERY Left    HAND TENDON SURGERY Right     Current Medications: Current Meds  Medication Sig   carvedilol (COREG) 25 MG tablet Take 1 tablet (25 mg total) by mouth daily. (Patient taking differently: Take 25 mg by mouth in the morning.)   carvedilol (COREG) 6.25 MG tablet Take 1 tablet (6.25 mg total) by mouth 2 (two) times daily. (Patient taking differently: Take 6.25 mg by mouth every evening.)   Cholecalciferol 25 MCG (1000 UT) tablet Take 1 tablet by mouth daily.   folic acid (FOLVITE) 1 MG tablet Take 1 tablet by mouth daily.   hydroxychloroquine (PLAQUENIL) 200 MG tablet Take 1 tablet by mouth 2 (two) times daily.    levothyroxine (SYNTHROID) 75 MCG tablet Take 1 tablet (75 mcg total) by mouth daily before breakfast.    methotrexate (RHEUMATREX) 2.5 MG tablet Take 2.5 mg by mouth once a week.   predniSONE (DELTASONE) 5 MG tablet Take 5 mg by mouth daily.   testosterone (ANDROGEL) 50 MG/5GM (1%) GEL Place 5 g onto the skin daily.   Tofacitinib Citrate (XELJANZ PO) Take 1 tablet by mouth daily.   vitamin B-12 (CYANOCOBALAMIN) 1000 MCG tablet Take 1 tablet by mouth daily.     Allergies:   Patient has no known allergies.   Social History   Socioeconomic History   Marital status: Significant Other    Spouse name: Not on file   Number of children: Not on file   Years of education: Not on file   Highest education level: Not on file  Occupational History   Not on file  Tobacco Use   Smoking status: Never   Smokeless tobacco: Never  Vaping Use   Vaping Use: Never used  Substance and Sexual Activity   Alcohol use: Never    Comment: rarely   Drug use: Never   Sexual activity: Not on file  Other Topics Concern   Not on file  Social History Narrative   Not on file   Social Determinants of Health   Financial Resource Strain: Not on file  Food Insecurity: Not on file  Transportation Needs: Not on file  Physical Activity: Not on file  Stress: Not on file  Social Connections: Not on file     Family History: The patient's family  history includes Diabetes in his mother; Heart disease in his mother; Hypertension in his father; Liver disease in his mother; Pancreatitis in his mother. There is no history of Colon cancer, Colon polyps, Stomach cancer, Esophageal cancer, Pancreatic cancer, or Rectal cancer. ROS:   Please see the history of present illness.    All 14 point review of systems negative except as described per history of present illness  EKGs/Labs/Other Studies Reviewed:      Recent Labs: 05/22/2021: ALT 21; BUN 10; Creatinine, Ser 0.86; Hemoglobin 15.7; Platelets 226; Potassium 4.5; Sodium 140; TSH 1.720  Recent Lipid Panel No results found for: CHOL, TRIG, HDL, CHOLHDL, VLDL, LDLCALC,  LDLDIRECT  Physical Exam:    VS:  BP 124/76 (BP Location: Left Arm, Patient Position: Sitting)   Pulse 74   Ht 6\' 2"  (1.88 m)   Wt 201 lb (91.2 kg)   SpO2 98%   BMI 25.81 kg/m     Wt Readings from Last 3 Encounters:  12/27/21 201 lb (91.2 kg)  11/24/21 197 lb (89.4 kg)  09/22/21 197 lb (89.4 kg)     GEN:  Well nourished, well developed in no acute distress HEENT: Normal NECK: No JVD; No carotid bruits LYMPHATICS: No lymphadenopathy CARDIAC: RRR, no murmurs, no rubs, no gallops RESPIRATORY:  Clear to auscultation without rales, wheezing or rhonchi  ABDOMEN: Soft, non-tender, non-distended MUSCULOSKELETAL:  No edema; No deformity  SKIN: Warm and dry LOWER EXTREMITIES: no swelling NEUROLOGIC:  Alert and oriented x 3 PSYCHIATRIC:  Normal affect   ASSESSMENT:    1. Dilated cardiomyopathy (HCC)   2. Benign essential hypertension   3. Rheumatoid arthritis involving multiple sites with positive rheumatoid factor (HCC)   4. Palpitations    PLAN:    In order of problems listed above:  History of cardiomyopathy repeated echocardiogram showed normalization.  Medications appropriate I will continue.  That include carvedilol. History of hypertension blood pressure well controlled continue present management. Cholesterol profile, I get the results of his fasting lipid profile which showed total cholesterol 158, HDL is 48, interesting LDL has not been given however triglycerides to HDL ratio was 3.3 which is normal we will continue risk factors modifications.   Medication Adjustments/Labs and Tests Ordered: Current medicines are reviewed at length with the patient today.  Concerns regarding medicines are outlined above.  Orders Placed This Encounter  Procedures   EKG 12-Lead   Medication changes: No orders of the defined types were placed in this encounter.   Signed, 09/24/21, MD, [facility] 12/27/2021 4:05 PM    [facility]

## 2021-12-27 NOTE — Progress Notes (Signed)
mb

## 2022-01-08 DIAGNOSIS — M0579 Rheumatoid arthritis with rheumatoid factor of multiple sites without organ or systems involvement: Secondary | ICD-10-CM | POA: Diagnosis not present

## 2022-01-08 DIAGNOSIS — Z79899 Other long term (current) drug therapy: Secondary | ICD-10-CM | POA: Diagnosis not present

## 2022-01-10 DIAGNOSIS — Z79899 Other long term (current) drug therapy: Secondary | ICD-10-CM | POA: Diagnosis not present

## 2022-01-10 DIAGNOSIS — M0579 Rheumatoid arthritis with rheumatoid factor of multiple sites without organ or systems involvement: Secondary | ICD-10-CM | POA: Diagnosis not present

## 2022-03-28 DIAGNOSIS — E291 Testicular hypofunction: Secondary | ICD-10-CM | POA: Diagnosis not present

## 2022-04-04 DIAGNOSIS — M0579 Rheumatoid arthritis with rheumatoid factor of multiple sites without organ or systems involvement: Secondary | ICD-10-CM | POA: Diagnosis not present

## 2022-04-04 DIAGNOSIS — Z79899 Other long term (current) drug therapy: Secondary | ICD-10-CM | POA: Diagnosis not present

## 2022-05-01 DIAGNOSIS — E039 Hypothyroidism, unspecified: Secondary | ICD-10-CM | POA: Diagnosis not present

## 2022-05-01 DIAGNOSIS — I10 Essential (primary) hypertension: Secondary | ICD-10-CM | POA: Diagnosis not present

## 2022-05-01 DIAGNOSIS — Z789 Other specified health status: Secondary | ICD-10-CM | POA: Diagnosis not present

## 2022-05-01 DIAGNOSIS — I420 Dilated cardiomyopathy: Secondary | ICD-10-CM | POA: Diagnosis not present

## 2022-05-17 DIAGNOSIS — H8110 Benign paroxysmal vertigo, unspecified ear: Secondary | ICD-10-CM | POA: Insufficient documentation

## 2022-05-30 DIAGNOSIS — M0579 Rheumatoid arthritis with rheumatoid factor of multiple sites without organ or systems involvement: Secondary | ICD-10-CM | POA: Diagnosis not present

## 2022-05-30 DIAGNOSIS — Z79899 Other long term (current) drug therapy: Secondary | ICD-10-CM | POA: Diagnosis not present

## 2022-06-04 DIAGNOSIS — Z79899 Other long term (current) drug therapy: Secondary | ICD-10-CM | POA: Diagnosis not present

## 2022-06-04 DIAGNOSIS — M0579 Rheumatoid arthritis with rheumatoid factor of multiple sites without organ or systems involvement: Secondary | ICD-10-CM | POA: Diagnosis not present

## 2022-07-20 DIAGNOSIS — E039 Hypothyroidism, unspecified: Secondary | ICD-10-CM | POA: Diagnosis not present

## 2022-07-20 DIAGNOSIS — Z789 Other specified health status: Secondary | ICD-10-CM | POA: Diagnosis not present

## 2022-07-20 DIAGNOSIS — M0579 Rheumatoid arthritis with rheumatoid factor of multiple sites without organ or systems involvement: Secondary | ICD-10-CM | POA: Diagnosis not present

## 2022-07-20 DIAGNOSIS — Z79899 Other long term (current) drug therapy: Secondary | ICD-10-CM | POA: Diagnosis not present

## 2022-09-26 ENCOUNTER — Other Ambulatory Visit: Payer: Self-pay | Admitting: Cardiology

## 2023-01-05 DIAGNOSIS — M4310 Spondylolisthesis, site unspecified: Secondary | ICD-10-CM | POA: Insufficient documentation

## 2023-03-04 DIAGNOSIS — M545 Low back pain, unspecified: Secondary | ICD-10-CM | POA: Insufficient documentation

## 2023-03-26 DIAGNOSIS — M5416 Radiculopathy, lumbar region: Secondary | ICD-10-CM | POA: Insufficient documentation

## 2023-03-27 ENCOUNTER — Other Ambulatory Visit: Payer: Self-pay | Admitting: Cardiology

## 2023-06-12 ENCOUNTER — Ambulatory Visit: Payer: BC Managed Care – PPO | Attending: Cardiology | Admitting: Cardiology

## 2023-06-12 VITALS — BP 100/78 | HR 68 | Ht 73.0 in | Wt 203.0 lb

## 2023-06-12 DIAGNOSIS — M0579 Rheumatoid arthritis with rheumatoid factor of multiple sites without organ or systems involvement: Secondary | ICD-10-CM | POA: Diagnosis not present

## 2023-06-12 DIAGNOSIS — I10 Essential (primary) hypertension: Secondary | ICD-10-CM

## 2023-06-12 DIAGNOSIS — E039 Hypothyroidism, unspecified: Secondary | ICD-10-CM | POA: Diagnosis not present

## 2023-06-12 NOTE — Progress Notes (Signed)
Cardiology Office Note:    Date:  06/12/2023   ID:  Stefani Dama, DOB 05/04/1966, MRN [id number]  PCP:  Francee Gentile, MD  Cardiologist:  Gypsy Balsam, MD    Referring MD: Francee Gentile, MD   Chief Complaint  Patient presents with   Follow-up    History of Present Illness:    Cameron Olson is a 57 y.o. male with past medical history significant for cardiomyopathy years ago he got echocardiogram done which showed mildly reduced left ventricle ejection fraction after that multiple stress test showing preserved normal ejection fraction, PVCs.  Comes today to months for follow-up overall seems to be doing well.  He developed some back issue which prevent him from exercises on the regular basis but still walk quite regularly.  Denies have any chest pain tightness squeezing pressure burning chest palpitations under control.  Past Medical History:  Diagnosis Date   Dilated cardiomyopathy (HCC)    Hyperlipidemia    Hypothyroidism    Palpitations    Rheumatoid arthritis (HCC)    Rheumatoid arthritis    Past Surgical History:  Procedure Laterality Date   HAND SURGERY Left    HAND TENDON SURGERY Right     Current Medications: Current Meds  Medication Sig   carvedilol (COREG) 25 MG tablet Take 1 tablet (25 mg total) by mouth daily. (Patient taking differently: Take 25 mg by mouth in the morning.)   carvedilol (COREG) 6.25 MG tablet TAKE 1 TABLET TWICE A DAY   Cholecalciferol 25 MCG (1000 UT) tablet Take 1 tablet by mouth daily.   folic acid (FOLVITE) 1 MG tablet Take 1 tablet by mouth daily.   hydroxychloroquine (PLAQUENIL) 200 MG tablet Take 1 tablet by mouth 2 (two) times daily.    levothyroxine (SYNTHROID) 75 MCG tablet Take 1 tablet (75 mcg total) by mouth daily before breakfast.   methotrexate (RHEUMATREX) 2.5 MG tablet Take 2.5 mg by mouth once a week.   predniSONE (DELTASONE) 5 MG tablet Take 5 mg by mouth daily.   testosterone (ANDROGEL) 50 MG/5GM (1%) GEL  Place 5 g onto the skin daily.   Tofacitinib Citrate (XELJANZ PO) Take 1 tablet by mouth daily.   vitamin B-12 (CYANOCOBALAMIN) 1000 MCG tablet Take 1 tablet by mouth daily.     Allergies:   Latex   Social History   Socioeconomic History   Marital status: Significant Other    Spouse name: Not on file   Number of children: Not on file   Years of education: Not on file   Highest education level: Not on file  Occupational History   Not on file  Tobacco Use   Smoking status: Never   Smokeless tobacco: Never  Vaping Use   Vaping status: Never Used  Substance and Sexual Activity   Alcohol use: Never    Comment: rarely   Drug use: Never   Sexual activity: Not on file  Other Topics Concern   Not on file  Social History Narrative   Not on file   Social Determinants of Health   Financial Resource Strain: Not on file  Food Insecurity: Low Risk  (04/26/2023)   Received from [facility]   Hunger Vital Sign    Worried About Running Out of Food in the Last Year: Never true    Ran Out of Food in the Last Year: Never true  Transportation Needs: No Transportation Needs (04/26/2023)   Received from [facility]    In the past 12 months,  has lack of reliable transportation kept you from medical appointments, meetings, work or from getting things needed for daily living? : No  Physical Activity: Not on file  Stress: Not on file  Social Connections: Not on file     Family History: The patient's family history includes Diabetes in his mother; Heart disease in his mother; Hypertension in his father; Liver disease in his mother; Pancreatitis in his mother. There is no history of Colon cancer, Colon polyps, Stomach cancer, Esophageal cancer, Pancreatic cancer, or Rectal cancer. ROS:   Please see the history of present illness.    All 14 point review of systems negative except as described per history of present illness  EKGs/Labs/Other Studies Reviewed:          Recent Labs: No results found for requested labs within last 365 days.  Recent Lipid Panel No results found for: "CHOL", "TRIG", "HDL", "CHOLHDL", "VLDL", "LDLCALC", "LDLDIRECT"  Physical Exam:    VS:  BP 100/78 (BP Location: Left Arm, Patient Position: Sitting)   Pulse 68   Ht 6\' 1"  (1.854 m)   Wt 203 lb (92.1 kg)   SpO2 98%   BMI 26.78 kg/m     Wt Readings from Last 3 Encounters:  06/12/23 203 lb (92.1 kg)  12/27/21 201 lb (91.2 kg)  11/24/21 197 lb (89.4 kg)     GEN:  Well nourished, well developed in no acute distress HEENT: Normal NECK: No JVD; No carotid bruits LYMPHATICS: No lymphadenopathy CARDIAC: RRR, no murmurs, no rubs, no gallops RESPIRATORY:  Clear to auscultation without rales, wheezing or rhonchi  ABDOMEN: Soft, non-tender, non-distended MUSCULOSKELETAL:  No edema; No deformity  SKIN: Warm and dry LOWER EXTREMITIES: no swelling NEUROLOGIC:  Alert and oriented x 3 PSYCHIATRIC:  Normal affect   ASSESSMENT:    1. Benign essential hypertension   2. Acquired hypothyroidism   3. Rheumatoid arthritis involving multiple sites with positive rheumatoid factor (HCC)    PLAN:    In order of problems listed above:  Benign essential hypertension: Blood pressure well-controlled continue present management. Hypothyroidism.  I did review K PN which show me his TSH of 1.72. Rheumatoid arthritis follow-up like always excellently by rheumatology team. PVCs under control.  Denies have any palpitation continue 12.5 Coreg in the morning 6.25 at evening time. History of cardiomyopathy repeated echocardiogram thereafter showed preserved ejection fraction.  Will continue monitoring   Medication Adjustments/Labs and Tests Ordered: Current medicines are reviewed at length with the patient today.  Concerns regarding medicines are outlined above.  Orders Placed This Encounter  Procedures   EKG 12-Lead   Medication changes: No orders of the defined types were placed in  this encounter.   Signed, Georgeanna Lea, MD, [facility] 06/12/2023 3:55 PM    [facility]

## 2023-06-12 NOTE — Patient Instructions (Signed)

## 2023-07-15 DIAGNOSIS — M79646 Pain in unspecified finger(s): Secondary | ICD-10-CM | POA: Insufficient documentation

## 2023-07-16 DIAGNOSIS — M65949 Unspecified synovitis and tenosynovitis, unspecified hand: Secondary | ICD-10-CM | POA: Insufficient documentation

## 2023-11-27 ENCOUNTER — Telehealth: Payer: Self-pay

## 2023-11-27 NOTE — Telephone Encounter (Signed)
 Message sent to front desk to move appt up per Dr. Krasowski

## 2023-12-17 ENCOUNTER — Ambulatory Visit: Attending: Cardiology | Admitting: Cardiology

## 2023-12-17 ENCOUNTER — Encounter: Payer: Self-pay | Admitting: Cardiology

## 2023-12-17 VITALS — BP 110/74 | HR 66 | Ht 74.0 in | Wt 205.0 lb

## 2023-12-17 DIAGNOSIS — M0579 Rheumatoid arthritis with rheumatoid factor of multiple sites without organ or systems involvement: Secondary | ICD-10-CM

## 2023-12-17 DIAGNOSIS — R0609 Other forms of dyspnea: Secondary | ICD-10-CM | POA: Diagnosis not present

## 2023-12-17 DIAGNOSIS — I10 Essential (primary) hypertension: Secondary | ICD-10-CM | POA: Diagnosis not present

## 2023-12-17 MED ORDER — CARVEDILOL 25 MG PO TABS
12.5000 mg | ORAL_TABLET | Freq: Every day | ORAL | 3 refills | Status: DC
Start: 2023-12-17 — End: 2024-06-17

## 2023-12-17 NOTE — Progress Notes (Signed)
 Cardiology Office Note:    Date:  12/17/2023   ID:  Cameron Olson, DOB 13-Aug-1965, MRN [id number]  PCP:  Ziolkowska, Aldona, MD  Cardiologist:  Ralene Burger, MD    Referring MD: Ziolkowska, Aldona, MD   Chief Complaint  Patient presents with   Follow-up    History of Present Illness:    Cameron Olson is a 58 y.o. male past medical history significant for cardiomyopathy detected many years ago after that his ejection fraction has been always normal he also does have rheumatoid arthritis, some PVCs.  Comes today to months for follow-up he described episode of weakness fatigue and tiredness not much palpitations he did struggle with some rheumatoid arthritis but otherwise seems to be doing fine  Past Medical History:  Diagnosis Date   Dilated cardiomyopathy (HCC)    Hyperlipidemia    Hypothyroidism    Palpitations    Rheumatoid arthritis (HCC)    Rheumatoid arthritis    Past Surgical History:  Procedure Laterality Date   HAND SURGERY Left    HAND TENDON SURGERY Right     Current Medications: Current Meds  Medication Sig   carvedilol  (COREG ) 25 MG tablet Take 1 tablet (25 mg total) by mouth daily. (Patient taking differently: Take 25 mg by mouth in the morning.)   carvedilol  (COREG ) 6.25 MG tablet TAKE 1 TABLET TWICE A DAY   Cholecalciferol 25 MCG (1000 UT) tablet Take 1 tablet by mouth daily.   folic acid (FOLVITE) 1 MG tablet Take 1 tablet by mouth daily.   hydroxychloroquine (PLAQUENIL) 200 MG tablet Take 1 tablet by mouth 2 (two) times daily.    levothyroxine  (SYNTHROID ) 75 MCG tablet Take 1 tablet (75 mcg total) by mouth daily before breakfast.   methotrexate (RHEUMATREX) 2.5 MG tablet Take 2.5 mg by mouth once a week.   predniSONE (DELTASONE) 5 MG tablet Take 5 mg by mouth daily.   tadalafil (CIALIS) 5 MG tablet Take 5 mg by mouth daily.   testosterone  (ANDROGEL ) 50 MG/5GM (1%) GEL Place 5 g onto the skin daily.   Tofacitinib Citrate (XELJANZ PO) Take 1 tablet  by mouth daily.   vitamin B-12 (CYANOCOBALAMIN ) 1000 MCG tablet Take 1 tablet by mouth daily.     Allergies:   Latex   Social History   Socioeconomic History   Marital status: Significant Other    Spouse name: Not on file   Number of children: Not on file   Years of education: Not on file   Highest education level: Not on file  Occupational History   Not on file  Tobacco Use   Smoking status: Never   Smokeless tobacco: Never  Vaping Use   Vaping status: Never Used  Substance and Sexual Activity   Alcohol use: Never    Comment: rarely   Drug use: Never   Sexual activity: Not on file  Other Topics Concern   Not on file  Social History Narrative   Not on file   Social Drivers of Health   Financial Resource Strain: Low Risk  (07/05/2023)   Received from [facility]   Overall Financial Resource Strain (CARDIA)    Difficulty of Paying Living Expenses: Not hard at all  Food Insecurity: No Food Insecurity (07/05/2023)   Received from [facility]   Hunger Vital Sign    Worried About Running Out of Food in the Last Year: Never true    Ran Out of Food in the Last Year: Never true  Transportation Needs: No Transportation Needs (07/05/2023)   Received from [facility]    In the past 12 months, has lack of transportation kept you from medical appointments or from getting medications?: No    Lack of Transportation (Non-Medical): No  Physical Activity: Not on file  Stress: Not on file  Social Connections: Not on file     Family History: The patient's family history includes Diabetes in his mother; Heart disease in his mother; Hypertension in his father; Liver disease in his mother; Pancreatitis in his mother. There is no history of Colon cancer, Colon polyps, Stomach cancer, Esophageal cancer, Pancreatic cancer, or Rectal cancer. ROS:   Please see the history of present illness.    All 14 point  review of systems negative except as described per history of present illness  EKGs/Labs/Other Studies Reviewed:         Recent Labs: No results found for requested labs within last 365 days.  Recent Lipid Panel No results found for: "CHOL", "TRIG", "HDL", "CHOLHDL", "VLDL", "LDLCALC", "LDLDIRECT"  Physical Exam:    VS:  BP 110/74 (BP Location: Right Arm, Patient Position: Sitting)   Pulse 66   Ht 6\' 2"  (1.88 m)   Wt 205 lb (93 kg)   SpO2 97%   BMI 26.32 kg/m     Wt Readings from Last 3 Encounters:  12/17/23 205 lb (93 kg)  06/12/23 203 lb (92.1 kg)  12/27/21 201 lb (91.2 kg)     GEN:  Well nourished, well developed in no acute distress HEENT: Normal NECK: No JVD; No carotid bruits LYMPHATICS: No lymphadenopathy CARDIAC: RRR, no murmurs, no rubs, no gallops RESPIRATORY:  Clear to auscultation without rales, wheezing or rhonchi  ABDOMEN: Soft, non-tender, non-distended MUSCULOSKELETAL:  No edema; No deformity  SKIN: Warm and dry LOWER EXTREMITIES: no swelling NEUROLOGIC:  Alert and oriented x 3 PSYCHIATRIC:  Normal affect   ASSESSMENT:    1. Benign essential hypertension   2. Rheumatoid arthritis involving multiple sites with positive rheumatoid factor (HCC)   3. Dyspnea on exertion    PLAN:    In order of problems listed above:  Essential hypertension blood pressure seems to be well-controlled continue present management. Rheumatoid arthritis like always follow-up up excellently by rheumatology team. Dyspnea on exertion.  Will schedule him to have echocardiogram to assess left ventricular ejection fraction   Medication Adjustments/Labs and Tests Ordered: Current medicines are reviewed at length with the patient today.  Concerns regarding medicines are outlined above.  No orders of the defined types were placed in this encounter.  Medication changes: No orders of the defined types were placed in this encounter.   Signed, Manfred Seed, MD,  [facility] 12/17/2023 1:56 PM    [facility]

## 2023-12-17 NOTE — Patient Instructions (Signed)
 Medication Instructions:  Your physician recommends that you continue on your current medications as directed. Please refer to the Current Medication list given to you today.  *If you need a refill on your cardiac medications before your next appointment, please call your pharmacy*  Lab Work: None If you have labs (blood work) drawn today and your tests are completely normal, you will receive your results only by: MyChart Message (if you have MyChart) OR A paper copy in the mail If you have any lab test that is abnormal or we need to change your treatment, we will call you to review the results.  Testing/Procedures: Your physician has requested that you have an echocardiogram. Echocardiography is a painless test that uses sound waves to create images of your heart. It provides your doctor with information about the size and shape of your heart and how well your heart's chambers and valves are working. This procedure takes approximately one hour. There are no restrictions for this procedure. Please do NOT wear cologne, perfume, aftershave, or lotions (deodorant is allowed). Please arrive 15 minutes prior to your appointment time.  Please note: We ask at that you not bring children with you during ultrasound (echo/ vascular) testing. Due to room size and safety concerns, children are not allowed in the ultrasound rooms during exams. Our front office staff cannot provide observation of children in our lobby area while testing is being conducted. An adult accompanying a patient to their appointment will only be allowed in the ultrasound room at the discretion of the ultrasound technician under special circumstances. We apologize for any inconvenience.   Follow-Up: At [facility], you and your health needs are our priority.  As part of our continuing mission to provide you with exceptional heart care, our providers are all part of one team.  This team includes your primary Cardiologist  (physician) and Advanced Practice Providers or APPs (Physician Assistants and Nurse Practitioners) who all work together to provide you with the care you need, when you need it.  Your next appointment:   6 month(s)  Provider:   Ralene Burger, MD    We recommend signing up for the patient portal called "MyChart".  Sign up information is provided on this After Visit Summary.  MyChart is used to connect with patients for Virtual Visits (Telemedicine).  Patients are able to view lab/test results, encounter notes, upcoming appointments, etc.  Non-urgent messages can be sent to your provider as well.   To learn more about what you can do with MyChart, go to [email or web address].   Other Instructions None

## 2023-12-17 NOTE — Addendum Note (Signed)
 Addended by: Aurelio Leer I on: 12/17/2023 02:12 PM   Modules accepted: Orders

## 2024-01-13 ENCOUNTER — Other Ambulatory Visit: Payer: Self-pay | Admitting: Cardiology

## 2024-01-13 DIAGNOSIS — I10 Essential (primary) hypertension: Secondary | ICD-10-CM

## 2024-01-13 DIAGNOSIS — M0579 Rheumatoid arthritis with rheumatoid factor of multiple sites without organ or systems involvement: Secondary | ICD-10-CM

## 2024-01-13 DIAGNOSIS — R0609 Other forms of dyspnea: Secondary | ICD-10-CM

## 2024-01-16 ENCOUNTER — Ambulatory Visit: Admitting: Cardiology

## 2024-01-23 ENCOUNTER — Ambulatory Visit (HOSPITAL_BASED_OUTPATIENT_CLINIC_OR_DEPARTMENT_OTHER)
Admission: RE | Admit: 2024-01-23 | Discharge: 2024-01-23 | Disposition: A | Discharge status: Home / Self Care | Source: Ambulatory Visit | Attending: Cardiology | Admitting: Cardiology

## 2024-01-23 DIAGNOSIS — R0609 Other forms of dyspnea: Secondary | ICD-10-CM | POA: Insufficient documentation

## 2024-01-23 LAB — ECHOCARDIOGRAM COMPLETE
AR max vel: 2.26 cm2
AV Area VTI: 2.67 cm2
AV Area mean vel: 2.5 cm2
AV Mean grad: 3 mmHg
AV Peak grad: 6.4 mmHg
Ao pk vel: 1.26 m/s
Area-P 1/2: 2.37 cm2
Calc EF: 67.4 %
MV M vel: 1.42 m/s
MV Peak grad: 8.1 mmHg
S' Lateral: 2.8 cm
Single Plane A2C EF: 66.2 %
Single Plane A4C EF: 67 %

## 2024-01-26 ENCOUNTER — Ambulatory Visit: Payer: Self-pay | Admitting: Cardiology

## 2024-06-17 ENCOUNTER — Encounter: Payer: Self-pay | Admitting: Cardiology

## 2024-06-17 ENCOUNTER — Other Ambulatory Visit: Payer: Self-pay

## 2024-06-17 MED ORDER — CARVEDILOL 6.25 MG PO TABS: 6.2500 mg | ORAL_TABLET | Freq: Two times a day (BID) | ORAL | 3 refills | Status: AC

## 2024-06-17 MED ORDER — CARVEDILOL 25 MG PO TABS: 12.5000 mg | ORAL_TABLET | Freq: Every day | ORAL | 3 refills | Status: AC
